# Patient Record
Sex: Male | Born: 1955
Health system: Southern US, Community
[De-identification: ages and names within clinical notes are randomized; demographics above are authoritative.]

## PROBLEM LIST (undated history)

## (undated) DIAGNOSIS — M549 Dorsalgia, unspecified: Secondary | ICD-10-CM

## (undated) DIAGNOSIS — F32A Depression, unspecified: Secondary | ICD-10-CM

## (undated) DIAGNOSIS — F419 Anxiety disorder, unspecified: Secondary | ICD-10-CM

## (undated) DIAGNOSIS — M1990 Unspecified osteoarthritis, unspecified site: Secondary | ICD-10-CM

## (undated) DIAGNOSIS — Z860100 Personal history of colon polyps, unspecified: Secondary | ICD-10-CM

## (undated) DIAGNOSIS — J449 Chronic obstructive pulmonary disease, unspecified: Secondary | ICD-10-CM

## (undated) DIAGNOSIS — I4891 Unspecified atrial fibrillation: Secondary | ICD-10-CM

## (undated) HISTORY — DX: Personal history of colon polyps, unspecified: Z86.0100

## (undated) HISTORY — PX: SPINAL CORD STIMULATOR IMPLANT: SHX2422

## (undated) HISTORY — DX: Personal history of colonic polyps: Z86.010

## (undated) HISTORY — DX: Anxiety disorder, unspecified: F41.9

## (undated) HISTORY — PX: NECK SURGERY: SHX720

## (undated) HISTORY — PX: WRIST SURGERY: SHX841

## (undated) HISTORY — PX: BRAIN SURGERY: SHX531

## (undated) HISTORY — DX: Depression, unspecified: F32.A

## (undated) HISTORY — PX: SHOULDER SURGERY: SHX246

---

## 1997-06-24 ENCOUNTER — Ambulatory Visit (HOSPITAL_COMMUNITY): Admission: EM | Admit: 1997-06-24 | Discharge: 1997-06-24 | Payer: Self-pay | Admitting: Neurosurgery

## 1998-04-07 ENCOUNTER — Emergency Department (HOSPITAL_COMMUNITY): Admission: EM | Admit: 1998-04-07 | Discharge: 1998-04-07 | Payer: Self-pay | Admitting: Emergency Medicine

## 1998-04-07 ENCOUNTER — Encounter: Payer: Self-pay | Admitting: Emergency Medicine

## 2010-02-26 ENCOUNTER — Encounter
Admission: RE | Admit: 2010-02-26 | Discharge: 2010-02-26 | Payer: Self-pay | Source: Home / Self Care | Attending: Orthopedic Surgery | Admitting: Orthopedic Surgery

## 2010-04-04 ENCOUNTER — Other Ambulatory Visit: Payer: Self-pay | Admitting: Orthopedic Surgery

## 2010-04-04 DIAGNOSIS — M25511 Pain in right shoulder: Secondary | ICD-10-CM

## 2010-04-09 ENCOUNTER — Ambulatory Visit
Admission: RE | Admit: 2010-04-09 | Discharge: 2010-04-09 | Disposition: A | Discharge status: Home / Self Care | Payer: Self-pay | Source: Ambulatory Visit | Attending: Orthopedic Surgery | Admitting: Orthopedic Surgery

## 2010-04-09 DIAGNOSIS — M25511 Pain in right shoulder: Secondary | ICD-10-CM

## 2010-04-30 ENCOUNTER — Encounter (HOSPITAL_COMMUNITY)
Admission: RE | Admit: 2010-04-30 | Discharge: 2010-04-30 | Disposition: A | Discharge status: Home / Self Care | Payer: Medicare Other | Source: Ambulatory Visit | Attending: Orthopedic Surgery | Admitting: Orthopedic Surgery

## 2010-04-30 DIAGNOSIS — Z0000 Encounter for general adult medical examination without abnormal findings: Secondary | ICD-10-CM | POA: Insufficient documentation

## 2010-04-30 LAB — BASIC METABOLIC PANEL
CO2: 31 mEq/L (ref 19–32)
Calcium: 9.2 mg/dL (ref 8.4–10.5)
GFR calc Af Amer: >60 mL/min (ref >60)
Glucose, Bld: 96 mg/dL (ref 70–99)
Potassium: 4.4 mEq/L (ref 3.5–5.1)
Sodium: 140 mEq/L (ref 135–145)

## 2010-04-30 LAB — CBC
HCT: 42.8 % (ref 39.0–52.0)
Hemoglobin: 14.6 g/dL (ref 13.0–17.0)
MCH: 29.8 pg (ref 26.0–34.0)
MCHC: 34.1 g/dL (ref 30.0–36.0)
MCV: 87.3 fL (ref 78.0–100.0)
RDW: 12.4 % (ref 11.5–15.5)

## 2010-04-30 LAB — URINALYSIS, ROUTINE W REFLEX MICROSCOPIC
Bilirubin Urine: NEGATIVE
Glucose, UA: NEGATIVE mg/dL
Hgb urine dipstick: NEGATIVE
Specific Gravity, Urine: 1.014 (ref 1.005–1.030)
Urobilinogen, UA: 0.2 mg/dL (ref 0.0–1.0)
pH: 6 (ref 5.0–8.0)

## 2010-04-30 LAB — DIFFERENTIAL
Basophils Absolute: 0 10*3/uL (ref 0.0–0.1)
Eosinophils Relative: 3 % (ref 0–5)
Lymphocytes Relative: 34 % (ref 12–46)
Monocytes Absolute: 0.7 10*3/uL (ref 0.1–1.0)
Monocytes Relative: 9 % (ref 3–12)
Neutro Abs: 4.3 10*3/uL (ref 1.7–7.7)

## 2010-04-30 LAB — SURGICAL PCR SCREEN: MRSA, PCR: NEGATIVE

## 2010-05-04 ENCOUNTER — Observation Stay (HOSPITAL_COMMUNITY)
Admission: RE | Admit: 2010-05-04 | Discharge: 2010-05-05 | Disposition: A | Discharge status: Home / Self Care | Payer: Medicare Other | Source: Ambulatory Visit | Attending: Orthopedic Surgery | Admitting: Orthopedic Surgery

## 2010-05-04 DIAGNOSIS — M25819 Other specified joint disorders, unspecified shoulder: Secondary | ICD-10-CM | POA: Insufficient documentation

## 2010-05-04 DIAGNOSIS — Z7902 Long term (current) use of antithrombotics/antiplatelets: Secondary | ICD-10-CM | POA: Insufficient documentation

## 2010-05-04 DIAGNOSIS — J45909 Unspecified asthma, uncomplicated: Secondary | ICD-10-CM | POA: Insufficient documentation

## 2010-05-04 DIAGNOSIS — Z79899 Other long term (current) drug therapy: Secondary | ICD-10-CM | POA: Insufficient documentation

## 2010-05-04 DIAGNOSIS — S43439A Superior glenoid labrum lesion of unspecified shoulder, initial encounter: Principal | ICD-10-CM | POA: Insufficient documentation

## 2010-05-04 DIAGNOSIS — I10 Essential (primary) hypertension: Secondary | ICD-10-CM | POA: Insufficient documentation

## 2010-05-04 DIAGNOSIS — X58XXXA Exposure to other specified factors, initial encounter: Secondary | ICD-10-CM | POA: Insufficient documentation

## 2010-05-05 LAB — BASIC METABOLIC PANEL
Chloride: 103 mEq/L (ref 96–112)
GFR calc non Af Amer: >60 mL/min (ref >60)
Glucose, Bld: 138 mg/dL — ABNORMAL HIGH (ref 70–99)
Potassium: 4.7 mEq/L (ref 3.5–5.1)
Sodium: 136 mEq/L (ref 135–145)

## 2010-05-05 LAB — CBC
HCT: 39.4 % (ref 39.0–52.0)
MCV: 87.6 fL (ref 78.0–100.0)
Platelets: 181 10*3/uL (ref 150–400)
RBC: 4.5 MIL/uL (ref 4.22–5.81)
RDW: 12.4 % (ref 11.5–15.5)
WBC: 8.4 10*3/uL (ref 4.0–10.5)

## 2010-05-05 NOTE — Op Note (Signed)
NAMECHANNON, AMBROSINI                ACCOUNT NO.:  [id number]  MEDICAL RECORD NO.:  [id number]           PATIENT TYPE:  O  LOCATION:  6834                         FACILITY:  [facility]  PHYSICIAN:  Doran Heater. Veverly Fells, M.D. DATE OF BIRTH:  1955-03-13  DATE OF PROCEDURE:  05/04/2010 DATE OF DISCHARGE:                              OPERATIVE REPORT   PREOPERATIVE DIAGNOSES:  Right shoulder impingement syndrome and superior labrum anterior and posterior lesion.  POSTOPERATIVE DIAGNOSES:  Right shoulder impingement syndrome and superior labrum anterior and posterior lesion.  PROCEDURE PERFORMED:  Right shoulder arthroscopy with extensive intraarticular debridement including debridement of superior labrum anterior and posterior lesion.  Arthroscopic biceps tenotomy, arthroscopic rotator interval release, arthroscopic subacromial decompression, limited secondary to os acromiale and open biceps tenodesis from the groove.  ATTENDING SURGEON:  Doran Heater. Veverly Fells, MD  ASSISTANT:  Abbott Pao. Dixon, PA-C.  ANESTHESIA:  General anesthesia was used plus interscalene block.  ESTIMATED BLOOD LOSS:  Minimal.  FLUID REPLACEMENT:  1200 mL of crystalloid.  INSTRUMENT COUNT:  Correct.  COMPLICATIONS:  There were no complications.  Perioperative antibiotics were given.  INDICATIONS:  The patient is a 55 year old male with worsening right shoulder pain secondary to slap lesion and impingement syndrome.  The patient has failed conservative management consisting of modification of activities, injections, anti-inflammatories, presents now for operative treatment.  Informed consent was obtained.  DESCRIPTION OF PROCEDURE:  After an adequate local anesthesia was achieved, the patient was positioned in a modified beach-chair position. Right shoulder was examined under anesthesia.  The patient did have some stiffness with excellent rotation and with his arm to the side, only able to externally rotate by 40  degrees.  Internal rotation was about 50 with his arm abducted, forward elevation was 140 to 150 degrees. Abduction was about 95-100 after exam under anesthesia including instability examination which was negative.  The patient had nice stable shoulder.  We went ahead and sterilely prepped and draped the shoulder and arm in usual manner.  We entered the shoulder using standard portals including anteroposterior and lateral portals.  We identified significant superior labral tear anterior to posterior with unstable biceps anchor, performed a biceps tenotomy and labral debridement, back to stable labral rim.  Anteroinferior labrum was also degeneratively torn in a general manner.  We performed a limited debridement of that area.  Chondroplasty was also performed, minimally sewed in the anteroinferior portion of glenoid which was a worn grade 3 and grade 4 changes.  The subscapularis was visualized and noted to be normal with a normal rolled edge.  Rotator cuff was visualized from the anterior and posterior portals and noted to be normal.  Posterior superior and posterior inferior labrum looked to be in pretty good shape.  We went ahead and placed the scope in subacromial space.  Thorough bursectomy and acromioplasty was performed.  We were very careful because of the os acromiale to just do a limited anterolateral acromioplasty basically at the very anterolateral edge more laterally than anteriorly.  The CA ligament was left intact, really did not appear to be any significant impingement from the shape of  the acromion process and again for fear of destabilizing a large os acromiale, we left that alone and we did press on the acromion and was felt to be stable, did not see any hypermobility there.  Rotator cuff was inspected in its entirety and was noted to be normal.  We took the shoulder through full range of motion, all impingement was noted.  We thoroughly irrigated the subdeltoid  interval and then concluded the arthroscopic portion of surgery.  We then made a small longitudinal incision overlying the biceps groove.  Dissection down through subcutaneous tissues using Bovie.  We split the deltoid in line with fibers bluntly.  I placed an Arthrex retractor, identified the biceps groove, divided the soft tissue overlying the biceps in line with a biceps tendon.  Delivered the tendon and the wound.  Whipstitched the tendon at the tenodesis area with #2 FiberWire suture and then went ahead and placed a Biomet juggernaut suture anchoring the floor of the bicipital groove, bring that suture up through, we reinforced the portion of tendon tying that down, flushed for nice low profile tenodesis, getting mid tension upward 90 degrees.  Once we completed that, we clipped off remaining biceps, took the shoulder through full range of motion, no impingement noted.  Thorough irrigation of the subdeltoid interval and repaired the deltoid to itself with 0 Vicryl suture, followed by 2-0 Vicryl subcutaneous closure and 4-0 Monocryl for skin and portals.  Steri-Strips applied followed by a sterile dressing. The patient tolerated the surgery well.  Doran Heater. Veverly Fells, M.D.     SRN/MEDQ  D:  05/04/2010  T:  05/05/2010  Job:  [id number]  Electronically Signed by Esmond Plants  on 05/05/2010 11:25:41 AM

## 2010-05-23 NOTE — Discharge Summary (Signed)
  [name], [name]                ACCOUNT NO.:  [id number]  MEDICAL RECORD NO.:  [id number]           PATIENT TYPE:  O  LOCATION:  9432                         FACILITY:  [facility]  PHYSICIAN:  Doran Heater. Veverly Fells, M.D. DATE OF BIRTH:  [birth year]  DATE OF ADMISSION:  05/04/2010 DATE OF DISCHARGE:  05/05/2010                              DISCHARGE SUMMARY   ADMISSION DIAGNOSIS:  Right shoulder pain secondary to impingement and superior labrum anterior and posterior lesion.  DISCHARGE DIAGNOSIS:  Right shoulder pain secondary to impingement and superior labrum anterior and posterior lesion status post shoulder arthroscopy.  BRIEF HISTORY:  The patient is a 55 year old male with worsening right shoulder pain secondary to a known superior labral tear along with some impingement to the right shoulder.  The patient elected for surgery to decrease pain and increase function.  PROCEDURE:  The patient had a right shoulder arthroscopy, biceps tenodesis performed by Dr. Esmond Plants on May 04, 2010, assistant was Shelle Iron, PA-C, general anesthesia was used, no complications.  HOSPITAL COURSE:  The patient was admitted on May 04, 2010, for the above-stated procedure which he tolerated well.  After adequate time in post anesthesia care, he was transferred to 5000.  Postop day 1, he has complained about moderate pain to the right shoulder.  He did stay until the afternoon to make sure his pain was under control.  Neurologically, he was intact.  No labs were given.  He was afebrile.  His wound was dressed and no signs of drainage.  DISCHARGE PLAN:  The patient will be discharged home on May 05, 2010. His condition is stable.  His diet is regular.  DISCHARGE MEDICATIONS: 1. Robaxin 500 mg p.o. q.6 h. 2. Percocet 5/325 one to two q.4-6 h. p.r.n. pain.     [name] Sanford, P.A.   ______________________________ Doran Heater. Veverly Fells, M.D.   TBD/MEDQ  D:  05/15/2010  T:  05/15/2010   Job:  [id number]  Electronically Signed by Shelle Iron P.A. on 05/22/2010 08:34:50 AM Electronically Signed by Esmond Plants  on 05/23/2010 08:08:13 PM

## 2013-12-10 DIAGNOSIS — G8929 Other chronic pain: Secondary | ICD-10-CM

## 2013-12-10 DIAGNOSIS — M549 Dorsalgia, unspecified: Secondary | ICD-10-CM | POA: Insufficient documentation

## 2013-12-10 DIAGNOSIS — T85695A Other mechanical complication of other nervous system device, implant or graft, initial encounter: Secondary | ICD-10-CM | POA: Insufficient documentation

## 2013-12-10 DIAGNOSIS — M546 Pain in thoracic spine: Secondary | ICD-10-CM

## 2013-12-10 HISTORY — DX: Other mechanical complication of other nervous system device, implant or graft, initial encounter: T85.695A

## 2013-12-10 HISTORY — DX: Other chronic pain: G89.29

## 2013-12-10 HISTORY — DX: Pain in thoracic spine: M54.6

## 2014-09-13 DIAGNOSIS — Z981 Arthrodesis status: Secondary | ICD-10-CM | POA: Insufficient documentation

## 2014-09-13 DIAGNOSIS — M545 Low back pain, unspecified: Secondary | ICD-10-CM | POA: Insufficient documentation

## 2014-09-13 DIAGNOSIS — M961 Postlaminectomy syndrome, not elsewhere classified: Secondary | ICD-10-CM | POA: Insufficient documentation

## 2014-09-13 HISTORY — DX: Low back pain, unspecified: M54.50

## 2014-09-13 HISTORY — DX: Arthrodesis status: Z98.1

## 2015-01-24 DIAGNOSIS — M545 Low back pain: Secondary | ICD-10-CM | POA: Diagnosis not present

## 2015-01-24 DIAGNOSIS — G8929 Other chronic pain: Secondary | ICD-10-CM | POA: Diagnosis not present

## 2015-02-02 DIAGNOSIS — Z79899 Other long term (current) drug therapy: Secondary | ICD-10-CM | POA: Diagnosis not present

## 2015-02-02 DIAGNOSIS — Z13220 Encounter for screening for lipoid disorders: Secondary | ICD-10-CM | POA: Diagnosis not present

## 2015-02-02 DIAGNOSIS — E291 Testicular hypofunction: Secondary | ICD-10-CM | POA: Diagnosis not present

## 2015-02-02 DIAGNOSIS — M546 Pain in thoracic spine: Secondary | ICD-10-CM | POA: Diagnosis not present

## 2015-02-08 DIAGNOSIS — F329 Major depressive disorder, single episode, unspecified: Secondary | ICD-10-CM | POA: Diagnosis not present

## 2015-02-08 DIAGNOSIS — M4727 Other spondylosis with radiculopathy, lumbosacral region: Secondary | ICD-10-CM | POA: Diagnosis not present

## 2015-02-08 DIAGNOSIS — E291 Testicular hypofunction: Secondary | ICD-10-CM | POA: Diagnosis not present

## 2015-02-08 DIAGNOSIS — R1013 Epigastric pain: Secondary | ICD-10-CM | POA: Diagnosis not present

## 2015-02-14 DIAGNOSIS — Z79899 Other long term (current) drug therapy: Secondary | ICD-10-CM | POA: Diagnosis not present

## 2015-02-14 DIAGNOSIS — G894 Chronic pain syndrome: Secondary | ICD-10-CM | POA: Diagnosis not present

## 2015-02-14 DIAGNOSIS — M961 Postlaminectomy syndrome, not elsewhere classified: Secondary | ICD-10-CM | POA: Diagnosis not present

## 2015-02-14 DIAGNOSIS — M545 Low back pain: Secondary | ICD-10-CM | POA: Diagnosis not present

## 2015-02-14 DIAGNOSIS — Z5181 Encounter for therapeutic drug level monitoring: Secondary | ICD-10-CM | POA: Diagnosis not present

## 2015-03-02 DIAGNOSIS — R1013 Epigastric pain: Secondary | ICD-10-CM | POA: Diagnosis not present

## 2015-03-02 DIAGNOSIS — L03119 Cellulitis of unspecified part of limb: Secondary | ICD-10-CM | POA: Diagnosis not present

## 2015-03-02 DIAGNOSIS — E291 Testicular hypofunction: Secondary | ICD-10-CM | POA: Diagnosis not present

## 2015-03-14 DIAGNOSIS — M961 Postlaminectomy syndrome, not elsewhere classified: Secondary | ICD-10-CM | POA: Diagnosis not present

## 2015-03-14 DIAGNOSIS — G894 Chronic pain syndrome: Secondary | ICD-10-CM | POA: Diagnosis not present

## 2015-03-14 DIAGNOSIS — M545 Low back pain: Secondary | ICD-10-CM | POA: Diagnosis not present

## 2015-03-14 DIAGNOSIS — M5417 Radiculopathy, lumbosacral region: Secondary | ICD-10-CM | POA: Diagnosis not present

## 2015-03-30 DIAGNOSIS — M2550 Pain in unspecified joint: Secondary | ICD-10-CM | POA: Diagnosis not present

## 2015-04-20 DIAGNOSIS — M4727 Other spondylosis with radiculopathy, lumbosacral region: Secondary | ICD-10-CM | POA: Diagnosis not present

## 2015-04-20 DIAGNOSIS — L219 Seborrheic dermatitis, unspecified: Secondary | ICD-10-CM | POA: Diagnosis not present

## 2015-04-21 DIAGNOSIS — M5417 Radiculopathy, lumbosacral region: Secondary | ICD-10-CM | POA: Diagnosis not present

## 2015-04-21 DIAGNOSIS — G894 Chronic pain syndrome: Secondary | ICD-10-CM | POA: Diagnosis not present

## 2015-04-21 DIAGNOSIS — M961 Postlaminectomy syndrome, not elsewhere classified: Secondary | ICD-10-CM | POA: Diagnosis not present

## 2015-04-21 DIAGNOSIS — M545 Low back pain: Secondary | ICD-10-CM | POA: Diagnosis not present

## 2015-04-27 DIAGNOSIS — Z79899 Other long term (current) drug therapy: Secondary | ICD-10-CM | POA: Diagnosis not present

## 2015-04-27 DIAGNOSIS — M5417 Radiculopathy, lumbosacral region: Secondary | ICD-10-CM | POA: Diagnosis not present

## 2015-04-27 DIAGNOSIS — G894 Chronic pain syndrome: Secondary | ICD-10-CM | POA: Diagnosis not present

## 2015-04-27 DIAGNOSIS — M961 Postlaminectomy syndrome, not elsewhere classified: Secondary | ICD-10-CM | POA: Diagnosis not present

## 2015-05-11 DIAGNOSIS — M4727 Other spondylosis with radiculopathy, lumbosacral region: Secondary | ICD-10-CM | POA: Diagnosis not present

## 2015-05-11 DIAGNOSIS — M2550 Pain in unspecified joint: Secondary | ICD-10-CM | POA: Diagnosis not present

## 2015-05-11 DIAGNOSIS — E291 Testicular hypofunction: Secondary | ICD-10-CM | POA: Diagnosis not present

## 2016-06-04 DIAGNOSIS — N390 Urinary tract infection, site not specified: Secondary | ICD-10-CM | POA: Insufficient documentation

## 2016-06-04 HISTORY — DX: Urinary tract infection, site not specified: N39.0

## 2016-06-19 DIAGNOSIS — R310 Gross hematuria: Secondary | ICD-10-CM | POA: Insufficient documentation

## 2016-06-19 HISTORY — DX: Gross hematuria: R31.0

## 2016-07-19 DIAGNOSIS — E785 Hyperlipidemia, unspecified: Secondary | ICD-10-CM | POA: Insufficient documentation

## 2016-07-19 DIAGNOSIS — F17200 Nicotine dependence, unspecified, uncomplicated: Secondary | ICD-10-CM | POA: Insufficient documentation

## 2016-07-19 DIAGNOSIS — R072 Precordial pain: Secondary | ICD-10-CM

## 2016-07-19 DIAGNOSIS — IMO0001 Reserved for inherently not codable concepts without codable children: Secondary | ICD-10-CM

## 2016-07-19 HISTORY — DX: Precordial pain: R07.2

## 2016-07-19 HISTORY — DX: Nicotine dependence, unspecified, uncomplicated: F17.200

## 2016-07-19 HISTORY — DX: Reserved for inherently not codable concepts without codable children: IMO0001

## 2016-12-31 DIAGNOSIS — Z9889 Other specified postprocedural states: Secondary | ICD-10-CM

## 2016-12-31 HISTORY — DX: Other specified postprocedural states: Z98.890

## 2017-04-01 DIAGNOSIS — M961 Postlaminectomy syndrome, not elsewhere classified: Secondary | ICD-10-CM | POA: Insufficient documentation

## 2017-04-01 DIAGNOSIS — T85192A Other mechanical complication of implanted electronic neurostimulator (electrode) of spinal cord, initial encounter: Secondary | ICD-10-CM

## 2017-04-01 HISTORY — DX: Postlaminectomy syndrome, not elsewhere classified: M96.1

## 2017-04-01 HISTORY — DX: Other mechanical complication of implanted electronic neurostimulator of spinal cord electrode (lead), initial encounter: T85.192A

## 2018-08-25 DIAGNOSIS — L02214 Cutaneous abscess of groin: Secondary | ICD-10-CM | POA: Insufficient documentation

## 2018-08-25 DIAGNOSIS — R1031 Right lower quadrant pain: Secondary | ICD-10-CM

## 2018-08-25 HISTORY — DX: Right lower quadrant pain: R10.31

## 2018-08-25 HISTORY — DX: Cutaneous abscess of groin: L02.214

## 2019-05-09 ENCOUNTER — Emergency Department (HOSPITAL_COMMUNITY): Payer: Medicare Other

## 2019-05-09 ENCOUNTER — Inpatient Hospital Stay (HOSPITAL_COMMUNITY)
Admission: EM | Admit: 2019-05-09 | Discharge: 2019-05-11 | DRG: 314 | Disposition: A | Discharge status: Home / Self Care | Payer: Medicare Other | Attending: Internal Medicine | Admitting: Internal Medicine

## 2019-05-09 ENCOUNTER — Observation Stay (HOSPITAL_COMMUNITY): Payer: Medicare Other

## 2019-05-09 ENCOUNTER — Other Ambulatory Visit: Payer: Self-pay

## 2019-05-09 ENCOUNTER — Encounter (HOSPITAL_COMMUNITY): Payer: Self-pay | Admitting: Emergency Medicine

## 2019-05-09 DIAGNOSIS — F17210 Nicotine dependence, cigarettes, uncomplicated: Secondary | ICD-10-CM | POA: Diagnosis present

## 2019-05-09 DIAGNOSIS — I509 Heart failure, unspecified: Secondary | ICD-10-CM

## 2019-05-09 DIAGNOSIS — I2723 Pulmonary hypertension due to lung diseases and hypoxia: Principal | ICD-10-CM | POA: Diagnosis present

## 2019-05-09 DIAGNOSIS — M549 Dorsalgia, unspecified: Secondary | ICD-10-CM | POA: Diagnosis present

## 2019-05-09 DIAGNOSIS — I5033 Acute on chronic diastolic (congestive) heart failure: Secondary | ICD-10-CM | POA: Diagnosis present

## 2019-05-09 DIAGNOSIS — Z9682 Presence of neurostimulator: Secondary | ICD-10-CM

## 2019-05-09 DIAGNOSIS — E669 Obesity, unspecified: Secondary | ICD-10-CM | POA: Diagnosis present

## 2019-05-09 DIAGNOSIS — Z20822 Contact with and (suspected) exposure to covid-19: Secondary | ICD-10-CM | POA: Diagnosis present

## 2019-05-09 DIAGNOSIS — I4891 Unspecified atrial fibrillation: Secondary | ICD-10-CM

## 2019-05-09 DIAGNOSIS — Z23 Encounter for immunization: Secondary | ICD-10-CM

## 2019-05-09 DIAGNOSIS — Z79899 Other long term (current) drug therapy: Secondary | ICD-10-CM

## 2019-05-09 DIAGNOSIS — R0600 Dyspnea, unspecified: Secondary | ICD-10-CM

## 2019-05-09 DIAGNOSIS — R7989 Other specified abnormal findings of blood chemistry: Secondary | ICD-10-CM | POA: Diagnosis present

## 2019-05-09 DIAGNOSIS — J449 Chronic obstructive pulmonary disease, unspecified: Secondary | ICD-10-CM | POA: Diagnosis present

## 2019-05-09 DIAGNOSIS — Z79890 Hormone replacement therapy (postmenopausal): Secondary | ICD-10-CM

## 2019-05-09 DIAGNOSIS — M1990 Unspecified osteoarthritis, unspecified site: Secondary | ICD-10-CM | POA: Diagnosis present

## 2019-05-09 DIAGNOSIS — I480 Paroxysmal atrial fibrillation: Secondary | ICD-10-CM | POA: Diagnosis present

## 2019-05-09 DIAGNOSIS — G8929 Other chronic pain: Secondary | ICD-10-CM | POA: Diagnosis present

## 2019-05-09 DIAGNOSIS — Z6838 Body mass index (BMI) 38.0-38.9, adult: Secondary | ICD-10-CM

## 2019-05-09 HISTORY — DX: Heart failure, unspecified: I50.9

## 2019-05-09 HISTORY — DX: Unspecified osteoarthritis, unspecified site: M19.90

## 2019-05-09 HISTORY — DX: Dorsalgia, unspecified: M54.9

## 2019-05-09 HISTORY — DX: Chronic obstructive pulmonary disease, unspecified: J44.9

## 2019-05-09 LAB — CBC
HCT: 47 % (ref 39.0–52.0)
Hemoglobin: 14.8 g/dL (ref 13.0–17.0)
MCH: 30.2 pg (ref 26.0–34.0)
MCHC: 31.5 g/dL (ref 30.0–36.0)
MCV: 95.9 fL (ref 80.0–100.0)
Platelets: 216 10*3/uL (ref 150–400)
RBC: 4.9 MIL/uL (ref 4.22–5.81)
RDW: 13.4 % (ref 11.5–15.5)
WBC: 7.5 10*3/uL (ref 4.0–10.5)
nRBC: 0 % (ref 0.0–0.2)

## 2019-05-09 LAB — BASIC METABOLIC PANEL
Anion gap: 10 (ref 5–15)
BUN: 5 mg/dL — ABNORMAL LOW (ref 8–23)
CO2: 30 mmol/L (ref 22–32)
Calcium: 9 mg/dL (ref 8.9–10.3)
Chloride: 97 mmol/L — ABNORMAL LOW (ref 98–111)
Creatinine, Ser: 0.85 mg/dL (ref 0.61–1.24)
GFR calc Af Amer: >60 mL/min (ref >60)
GFR calc non Af Amer: >60 mL/min (ref >60)
Glucose, Bld: 142 mg/dL — ABNORMAL HIGH (ref 70–99)
Potassium: 4.1 mmol/L (ref 3.5–5.1)
Sodium: 137 mmol/L (ref 135–145)

## 2019-05-09 LAB — TROPONIN I (HIGH SENSITIVITY)
Troponin I (High Sensitivity): 8 ng/L (ref <18)
Troponin I (High Sensitivity): 8 ng/L (ref <18)

## 2019-05-09 LAB — BRAIN NATRIURETIC PEPTIDE: B Natriuretic Peptide: 247.7 pg/mL — ABNORMAL HIGH (ref 0.0–100.0)

## 2019-05-09 LAB — LIPID PANEL
Cholesterol: 191 mg/dL (ref 0–200)
HDL: 47 mg/dL (ref >40)
LDL Cholesterol: 131 mg/dL — ABNORMAL HIGH (ref 0–99)
Total CHOL/HDL Ratio: 4.1 RATIO
Triglycerides: 63 mg/dL (ref <150)
VLDL: 13 mg/dL (ref 0–40)

## 2019-05-09 LAB — ECHOCARDIOGRAM COMPLETE
Height: 72 in
Weight: 4512 oz

## 2019-05-09 LAB — HEMOGLOBIN A1C
Hgb A1c MFr Bld: 7.1 % — ABNORMAL HIGH (ref 4.8–5.6)
Mean Plasma Glucose: 157.07 mg/dL

## 2019-05-09 LAB — PHOSPHORUS: Phosphorus: 2.5 mg/dL (ref 2.5–4.6)

## 2019-05-09 LAB — TSH: TSH: 0.72 u[IU]/mL (ref 0.350–4.500)

## 2019-05-09 LAB — MAGNESIUM: Magnesium: 2.1 mg/dL (ref 1.7–2.4)

## 2019-05-09 LAB — RESPIRATORY PANEL BY RT PCR (FLU A&B, COVID)
Influenza A by PCR: NEGATIVE
Influenza B by PCR: NEGATIVE
SARS Coronavirus 2 by RT PCR: NEGATIVE

## 2019-05-09 MED ORDER — OXYCODONE HCL 5 MG PO TABS
30.0000 mg | ORAL_TABLET | ORAL | Status: DC | PRN
  Administered 2019-05-09 – 2019-05-11 (×10): 30 mg via ORAL
  Filled 2019-05-09 (×10): qty 6

## 2019-05-09 MED ORDER — METHYLPREDNISOLONE SODIUM SUCC 125 MG IJ SOLR
125.0000 mg | Freq: Once | INTRAMUSCULAR | Status: AC
  Administered 2019-05-09: 125 mg via INTRAVENOUS
  Filled 2019-05-09: qty 2

## 2019-05-09 MED ORDER — ACETAMINOPHEN 650 MG RE SUPP: 650.0000 mg | Freq: Four times a day (QID) | RECTAL | Status: DC | PRN

## 2019-05-09 MED ORDER — SODIUM CHLORIDE 0.9 % IV SOLN
500.0000 mg | Freq: Once | INTRAVENOUS | Status: AC
  Administered 2019-05-09: 500 mg via INTRAVENOUS
  Filled 2019-05-09: qty 500

## 2019-05-09 MED ORDER — FUROSEMIDE 10 MG/ML IJ SOLN
60.0000 mg | Freq: Once | INTRAMUSCULAR | Status: AC
  Administered 2019-05-09: 60 mg via INTRAVENOUS
  Filled 2019-05-09: qty 6

## 2019-05-09 MED ORDER — SODIUM CHLORIDE 0.9% FLUSH
3.0000 mL | Freq: Once | INTRAVENOUS | Status: AC
  Administered 2019-05-09: 3 mL via INTRAVENOUS

## 2019-05-09 MED ORDER — ALBUTEROL SULFATE (2.5 MG/3ML) 0.083% IN NEBU: 3.0000 mL | INHALATION_SOLUTION | RESPIRATORY_TRACT | Status: DC | PRN

## 2019-05-09 MED ORDER — ENOXAPARIN SODIUM 40 MG/0.4ML ~~LOC~~ SOLN
40.0000 mg | SUBCUTANEOUS | Status: DC
  Administered 2019-05-10: 40 mg via SUBCUTANEOUS
  Filled 2019-05-09: qty 0.4

## 2019-05-09 MED ORDER — MOMETASONE FURO-FORMOTEROL FUM 200-5 MCG/ACT IN AERO
2.0000 | INHALATION_SPRAY | Freq: Two times a day (BID) | RESPIRATORY_TRACT | Status: DC
  Administered 2019-05-10 – 2019-05-11 (×2): 2 via RESPIRATORY_TRACT
  Filled 2019-05-09: qty 8.8

## 2019-05-09 MED ORDER — ALBUTEROL SULFATE HFA 108 (90 BASE) MCG/ACT IN AERS
2.0000 | INHALATION_SPRAY | Freq: Once | RESPIRATORY_TRACT | Status: AC
  Administered 2019-05-09: 2 via RESPIRATORY_TRACT
  Filled 2019-05-09: qty 6.7

## 2019-05-09 MED ORDER — ACETAMINOPHEN 325 MG PO TABS: 650.0000 mg | ORAL_TABLET | Freq: Four times a day (QID) | ORAL | Status: DC | PRN

## 2019-05-09 MED ORDER — AZITHROMYCIN 1 G PO PACK
1.0000 g | PACK | Freq: Once | ORAL | Status: DC
Start: 2019-05-09 — End: 2019-05-09

## 2019-05-09 MED ORDER — SODIUM CHLORIDE 0.9 % IV SOLN
1.0000 g | Freq: Once | INTRAVENOUS | Status: AC
  Administered 2019-05-09: 1 g via INTRAVENOUS
  Filled 2019-05-09: qty 10

## 2019-05-09 MED ORDER — LINACLOTIDE 145 MCG PO CAPS
290.0000 ug | ORAL_CAPSULE | Freq: Every morning | ORAL | Status: DC
  Administered 2019-05-10 – 2019-05-11 (×2): 290 ug via ORAL
  Filled 2019-05-09 (×2): qty 2

## 2019-05-09 MED ORDER — HYDROMORPHONE HCL ER 8 MG PO T24A
16.0000 mg | EXTENDED_RELEASE_TABLET | Freq: Every day | ORAL | Status: DC
  Administered 2019-05-10 – 2019-05-11 (×2): 16 mg via ORAL
  Filled 2019-05-09 (×2): qty 2

## 2019-05-09 MED ORDER — IPRATROPIUM-ALBUTEROL 0.5-2.5 (3) MG/3ML IN SOLN
3.0000 mL | Freq: Three times a day (TID) | RESPIRATORY_TRACT | Status: DC
  Administered 2019-05-09: 3 mL via RESPIRATORY_TRACT
  Filled 2019-05-09 (×2): qty 3

## 2019-05-09 NOTE — ED Notes (Signed)
The pt is hungry  He is npo in his orders  Pt not happy

## 2019-05-09 NOTE — ED Notes (Signed)
Admitting doctors at the bedside githromax piggybacked into nss   The residents stopped the nss for diaphoresis

## 2019-05-09 NOTE — Progress Notes (Signed)
Tele called around 2200 that patient converted to NSR.   MD was made aware and EKG was done.

## 2019-05-09 NOTE — ED Notes (Signed)
Pt transported to Xray.

## 2019-05-09 NOTE — ED Notes (Signed)
Tech here to do his echo

## 2019-05-09 NOTE — ED Notes (Signed)
The pt is c/o lower back pain he usually takes oxycontin and or oxycodone  For the back pain  hes asking if he can take his own med  no

## 2019-05-09 NOTE — ED Provider Notes (Signed)
[facility] EMERGENCY DEPARTMENT Provider Note   CSN: [id number] Arrival date & time: 05/09/19  1040     History Chief Complaint  Patient presents with  . Shortness of Breath    [name] Sanford is a 64 y.o. male.  64 year old male with prior medical history as detailed below presents for evaluation of shortness of breath.  Patient reports gradually worsening shortness of breath over the last several days.  Symptoms became worse this morning.  He reports a longstanding history of COPD.  He attempted to use breathing treatment at home without improvement of his symptoms.  He reports a mild cough without production.  He denies fever.  He denies chest pain.  He denies known exposure to Covid positive patient.    The history is provided by the patient and medical records.  Shortness of Breath Severity:  Moderate Onset quality:  Gradual Duration:  3 days Timing:  Constant Progression:  Worsening Chronicity:  New Context: activity   Relieved by:  Nothing Worsened by:  Nothing      Past Medical History:  Diagnosis Date  . Arthritis   . Back pain   . COPD (chronic obstructive pulmonary disease) (HCC)     There are no problems to display for this patient.   Past Surgical History:  Procedure Laterality Date  . BRAIN SURGERY    . NECK SURGERY    . SHOULDER SURGERY    . SPINAL CORD STIMULATOR IMPLANT         No family history on file.  Social History   Tobacco Use  . Smoking status: Current Every Day Smoker  . Smokeless tobacco: Never Used  Substance Use Topics  . Alcohol use: Not Currently  . Drug use: Never    Home Medications Prior to Admission medications   Not on File    Allergies    Patient has no allergy information on record.  Review of Systems   Review of Systems  Respiratory: Positive for shortness of breath.   All other systems reviewed and are negative.   Physical Exam Updated Vital Signs BP (!) 153/77   Pulse 74    Temp 98.9 F (37.2 C) (Oral)   Resp 18   Ht 6' (1.829 m)   Wt 127.9 kg   SpO2 97%   BMI 38.25 kg/m   Physical Exam Vitals and nursing note reviewed.  Constitutional:      General: He is not in acute distress.    Appearance: He is well-developed.  HENT:     Head: Normocephalic and atraumatic.  Eyes:     Conjunctiva/sclera: Conjunctivae normal.     Pupils: Pupils are equal, round, and reactive to light.  Cardiovascular:     Rate and Rhythm: Normal rate and regular rhythm.     Heart sounds: Normal heart sounds.  Pulmonary:     Effort: Pulmonary effort is normal. No respiratory distress.     Comments: Mild diffuse expiratory wheezes in all lung fields Abdominal:     General: There is no distension.     Palpations: Abdomen is soft.     Tenderness: There is no abdominal tenderness.  Musculoskeletal:        General: No deformity. Normal range of motion.     Cervical back: Normal range of motion and neck supple.  Skin:    General: Skin is warm and dry.  Neurological:     General: No focal deficit present.     Mental Status: He is  alert and oriented to person, place, and time.     ED Results / Procedures / Treatments   Labs (all labs ordered are listed, but only abnormal results are displayed) Labs Reviewed  BASIC METABOLIC PANEL - Abnormal; Notable for the following components:      Result Value   Chloride 97 (*)    Glucose, Bld 142 (*)    BUN 5 (*)    All other components within normal limits  BRAIN NATRIURETIC PEPTIDE - Abnormal; Notable for the following components:   B Natriuretic Peptide 247.7 (*)    All other components within normal limits  RESPIRATORY PANEL BY RT PCR (FLU A&B, COVID)  CBC  TROPONIN I (HIGH SENSITIVITY)  TROPONIN I (HIGH SENSITIVITY)    EKG EKG Interpretation  Date/Time:  Sunday May 09 2019 12:57:33 EDT Ventricular Rate:  72 PR Interval:    QRS Duration: 84 QT Interval:  361 QTC Calculation: 395 R Axis:   72 Text  Interpretation: Atrial fibrillation Confirmed by Dene Gentry [phone]) on 05/09/2019 12:59:24 PM   Radiology DG Chest 2 View  Result Date: 05/09/2019 CLINICAL DATA:  Shortness of breath. EXAM: CHEST - 2 VIEW COMPARISON:  12/30/2014 FINDINGS: Heart size is mildly enlarged. There are fine, diffuse infiltrates throughout the lungs bilaterally. No Kerley B-lines or evidence for pulmonary venous congestion. No discrete consolidations. Remote cervical fusion. IMPRESSION: Diffuse infiltrates throughout the lungs bilaterally. Appearance favors infectious process over or edema. Electronically Signed   By: Nolon Nations M.D.   On: 05/09/2019 11:45    Procedures Procedures (including critical care time)  Medications Ordered in ED Medications  sodium chloride flush (NS) 0.9 % injection 3 mL (has no administration in time range)  albuterol (VENTOLIN HFA) 108 (90 Base) MCG/ACT inhaler 2 puff (has no administration in time range)  methylPREDNISolone sodium succinate (SOLU-MEDROL) 125 mg/2 mL injection 125 mg (has no administration in time range)  cefTRIAXone (ROCEPHIN) 1 g in sodium chloride 0.9 % 100 mL IVPB (has no administration in time range)  azithromycin (ZITHROMAX) 500 mg in sodium chloride 0.9 % 250 mL IVPB (has no administration in time range)    ED Course  I have reviewed the triage vital signs and the nursing notes.  Pertinent labs & imaging results that were available during my care of the patient were reviewed by me and considered in my medical decision making (see chart for details).  Clinical Course as of May 09 1231  Sun May 09, 2019  1152 Anion gap: 10 [PM]    Clinical Course User Index [PM] [name], MD   MDM Rules/Calculators/A&P                      MDM  Screen complete  [name] Sanford was evaluated in Emergency Department on 05/09/2019 for the symptoms described in the history of present illness. He was evaluated in the context of the global COVID-19 pandemic,  which necessitated consideration that the patient might be at risk for infection with the SARS-CoV-2 virus that causes COVID-19. Institutional protocols and algorithms that pertain to the evaluation of patients at risk for COVID-19 are in a state of rapid change based on information released by regulatory bodies including the CDC and federal and state organizations. These policies and algorithms were followed during the patient's care in the ED.  Patient is presenting for evaluation of reported shortness of breath.  Patient does have wheezing on exam.  His presentation is consistent with possible element  of COPD exacerbation.  Chest x-ray demonstrates possible infectious infiltrates.   Steroids and antibiotics initiated in the ED.  Following his ED course he does feel mildly improved.  Patient appears to be appropriate for admission.  Covid test is pending at time of admission.  His primary care is obtained at the [facility] in [place].  Medicine team is aware of case and will evaluate for admission.  Final Clinical Impression(s) / ED Diagnoses Final diagnoses:  Dyspnea, unspecified type    Rx / DC Orders ED Discharge Orders    None       [name], MD 05/09/19 1415
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
## 2019-05-09 NOTE — ED Notes (Signed)
Respiratory panel already resulted from earlier swab  Neg for flu and covid

## 2019-05-09 NOTE — ED Notes (Signed)
The pt had a nss drip  Because the zithromax iv was burning his arm

## 2019-05-09 NOTE — ED Triage Notes (Signed)
C/o SOB since last night that is worse when lying.  Reports bilateral leg swelling and swelling in face that has improved over the last few weeks. New onset Afib.

## 2019-05-09 NOTE — Progress Notes (Signed)
  Echocardiogram 2D Echocardiogram has been performed.  Elmer Ramp 05/09/2019, 4:38 PM

## 2019-05-10 DIAGNOSIS — I509 Heart failure, unspecified: Secondary | ICD-10-CM | POA: Diagnosis present

## 2019-05-10 DIAGNOSIS — G8929 Other chronic pain: Secondary | ICD-10-CM

## 2019-05-10 DIAGNOSIS — Z79890 Hormone replacement therapy (postmenopausal): Secondary | ICD-10-CM | POA: Diagnosis not present

## 2019-05-10 DIAGNOSIS — Z79899 Other long term (current) drug therapy: Secondary | ICD-10-CM | POA: Diagnosis not present

## 2019-05-10 DIAGNOSIS — R7989 Other specified abnormal findings of blood chemistry: Secondary | ICD-10-CM | POA: Diagnosis present

## 2019-05-10 DIAGNOSIS — I5033 Acute on chronic diastolic (congestive) heart failure: Secondary | ICD-10-CM | POA: Diagnosis present

## 2019-05-10 DIAGNOSIS — E669 Obesity, unspecified: Secondary | ICD-10-CM | POA: Diagnosis present

## 2019-05-10 DIAGNOSIS — Z23 Encounter for immunization: Secondary | ICD-10-CM | POA: Diagnosis present

## 2019-05-10 DIAGNOSIS — I2723 Pulmonary hypertension due to lung diseases and hypoxia: Secondary | ICD-10-CM | POA: Diagnosis present

## 2019-05-10 DIAGNOSIS — Z9682 Presence of neurostimulator: Secondary | ICD-10-CM | POA: Diagnosis not present

## 2019-05-10 DIAGNOSIS — M549 Dorsalgia, unspecified: Secondary | ICD-10-CM | POA: Diagnosis present

## 2019-05-10 DIAGNOSIS — Z87828 Personal history of other (healed) physical injury and trauma: Secondary | ICD-10-CM

## 2019-05-10 DIAGNOSIS — I480 Paroxysmal atrial fibrillation: Secondary | ICD-10-CM

## 2019-05-10 DIAGNOSIS — Z7951 Long term (current) use of inhaled steroids: Secondary | ICD-10-CM

## 2019-05-10 DIAGNOSIS — Z6838 Body mass index (BMI) 38.0-38.9, adult: Secondary | ICD-10-CM | POA: Diagnosis not present

## 2019-05-10 DIAGNOSIS — Z7901 Long term (current) use of anticoagulants: Secondary | ICD-10-CM

## 2019-05-10 DIAGNOSIS — F17210 Nicotine dependence, cigarettes, uncomplicated: Secondary | ICD-10-CM | POA: Diagnosis present

## 2019-05-10 DIAGNOSIS — J449 Chronic obstructive pulmonary disease, unspecified: Secondary | ICD-10-CM | POA: Diagnosis present

## 2019-05-10 DIAGNOSIS — Z20822 Contact with and (suspected) exposure to covid-19: Secondary | ICD-10-CM | POA: Diagnosis present

## 2019-05-10 DIAGNOSIS — I110 Hypertensive heart disease with heart failure: Secondary | ICD-10-CM | POA: Diagnosis not present

## 2019-05-10 DIAGNOSIS — M1990 Unspecified osteoarthritis, unspecified site: Secondary | ICD-10-CM | POA: Diagnosis present

## 2019-05-10 DIAGNOSIS — I5031 Acute diastolic (congestive) heart failure: Secondary | ICD-10-CM | POA: Diagnosis not present

## 2019-05-10 LAB — CBC WITH DIFFERENTIAL/PLATELET
Abs Immature Granulocytes: 0.06 10*3/uL (ref 0.00–0.07)
Basophils Absolute: 0 10*3/uL (ref 0.0–0.1)
Basophils Relative: 0 %
Eosinophils Absolute: 0 10*3/uL (ref 0.0–0.5)
Eosinophils Relative: 0 %
HCT: 45.3 % (ref 39.0–52.0)
Hemoglobin: 14.3 g/dL (ref 13.0–17.0)
Immature Granulocytes: 1 %
Lymphocytes Relative: 7 %
Lymphs Abs: 0.9 10*3/uL (ref 0.7–4.0)
MCH: 29.1 pg (ref 26.0–34.0)
MCHC: 31.6 g/dL (ref 30.0–36.0)
MCV: 92.3 fL (ref 80.0–100.0)
Monocytes Absolute: 0.8 10*3/uL (ref 0.1–1.0)
Monocytes Relative: 6 %
Neutro Abs: 10.9 10*3/uL — ABNORMAL HIGH (ref 1.7–7.7)
Neutrophils Relative %: 86 %
Platelets: 264 10*3/uL (ref 150–400)
RBC: 4.91 MIL/uL (ref 4.22–5.81)
RDW: 13.5 % (ref 11.5–15.5)
WBC: 12.6 10*3/uL — ABNORMAL HIGH (ref 4.0–10.5)
nRBC: 0 % (ref 0.0–0.2)

## 2019-05-10 LAB — RESPIRATORY PANEL BY PCR

## 2019-05-10 LAB — COMPREHENSIVE METABOLIC PANEL
ALT: 17 U/L (ref 0–44)
AST: 18 U/L (ref 15–41)
Albumin: 3.1 g/dL — ABNORMAL LOW (ref 3.5–5.0)
Alkaline Phosphatase: 69 U/L (ref 38–126)
Anion gap: 12 (ref 5–15)
BUN: 11 mg/dL (ref 8–23)
CO2: 34 mmol/L — ABNORMAL HIGH (ref 22–32)
Calcium: 9.2 mg/dL (ref 8.9–10.3)
Chloride: 94 mmol/L — ABNORMAL LOW (ref 98–111)
Creatinine, Ser: 1.17 mg/dL (ref 0.61–1.24)
GFR calc Af Amer: >60 mL/min (ref >60)
GFR calc non Af Amer: >60 mL/min (ref >60)
Glucose, Bld: 147 mg/dL — ABNORMAL HIGH (ref 70–99)
Potassium: 4.3 mmol/L (ref 3.5–5.1)
Sodium: 140 mmol/L (ref 135–145)
Total Bilirubin: 0.5 mg/dL (ref 0.3–1.2)
Total Protein: 6.5 g/dL (ref 6.5–8.1)

## 2019-05-10 LAB — HIV ANTIBODY (ROUTINE TESTING W REFLEX): HIV Screen 4th Generation wRfx: NONREACTIVE

## 2019-05-10 MED ORDER — FUROSEMIDE 10 MG/ML IJ SOLN
80.0000 mg | Freq: Two times a day (BID) | INTRAMUSCULAR | Status: AC
  Administered 2019-05-10: 80 mg via INTRAVENOUS
  Filled 2019-05-10: qty 8

## 2019-05-10 MED ORDER — FUROSEMIDE 10 MG/ML IJ SOLN
40.0000 mg | Freq: Two times a day (BID) | INTRAMUSCULAR | Status: DC
  Administered 2019-05-10: 40 mg via INTRAVENOUS
  Filled 2019-05-10: qty 4

## 2019-05-10 MED ORDER — IPRATROPIUM-ALBUTEROL 0.5-2.5 (3) MG/3ML IN SOLN
3.0000 mL | Freq: Three times a day (TID) | RESPIRATORY_TRACT | Status: DC
  Administered 2019-05-10: 3 mL via RESPIRATORY_TRACT
  Filled 2019-05-10: qty 3

## 2019-05-10 MED ORDER — IPRATROPIUM-ALBUTEROL 0.5-2.5 (3) MG/3ML IN SOLN: 3.0000 mL | Freq: Three times a day (TID) | RESPIRATORY_TRACT | Status: DC | PRN

## 2019-05-10 MED ORDER — FUROSEMIDE 10 MG/ML IJ SOLN: 60.0000 mg | Freq: Two times a day (BID) | INTRAMUSCULAR | Status: DC

## 2019-05-10 MED ORDER — INFLUENZA VAC SPLIT QUAD 0.5 ML IM SUSY
0.5000 mL | PREFILLED_SYRINGE | INTRAMUSCULAR | Status: AC
  Administered 2019-05-11: 0.5 mL via INTRAMUSCULAR
  Filled 2019-05-10: qty 0.5

## 2019-05-10 MED ORDER — NICOTINE 14 MG/24HR TD PT24
14.0000 mg | MEDICATED_PATCH | Freq: Every day | TRANSDERMAL | Status: DC
  Administered 2019-05-10 – 2019-05-11 (×2): 14 mg via TRANSDERMAL
  Filled 2019-05-10 (×2): qty 1

## 2019-05-10 NOTE — Evaluation (Signed)
Physical Therapy Evaluation Patient Details Name: [name] Sanford MRN: [id number] DOB: February 03, 1956 Today's Date: 05/10/2019   History of Present Illness  Pt is a 64 y/o male with PMH of COPD, neck surgery, and chronic pain, presenting for SOB and LE swelling of 2-3 months. Found with elevated BNP and concernting for CHF exacerbation, a fib.   Clinical Impression  Pt was seen for mobility and strengthening, noted his fluctuations of O2 sats that are occurring on room air at rest and with activity.  Will follow up tomorrow to be sure that lasix is managing his O2 needs to require only room air, and to be sure he is tolerant even of stairs for home.  Nursing aware of PT plan to return tomorrow and recheck and report to MD and nursing.  Follow acutely for these needs, and will not need a plan to follow up at DC for now with PT.    Follow Up Recommendations No PT follow up    Equipment Recommendations  None recommended by PT    Recommendations for Other Services       Precautions / Restrictions Precautions Precautions: Fall Precaution Comments: monitor O2 sats Restrictions Weight Bearing Restrictions: No      Mobility  Bed Mobility Overal bed mobility: Modified Independent             General bed mobility comments: using rail and extra time  Transfers Overall transfer level: Modified independent Equipment used: 1 person hand held assist             General transfer comment: increased attempts and time  Ambulation/Gait Ambulation/Gait assistance: Min guard(for safety) Gait Distance (Feet): 100 Feet Assistive device: 1 person hand held assist Gait Pattern/deviations: Step-through pattern;Drifts right/left;Wide base of support;Shuffle;Decreased stride length;Decreased weight shift to right Gait velocity: reduced Gait velocity interpretation: <1.31 ft/sec, indicative of household ambulator General Gait Details: minimizing R knee pain with wgt shift, sats dropped with gait  and remained non verbal and with two standing rests to manage room air  Stairs Stairs: (pended due to O2 sats on room air)          Wheelchair Mobility    Modified Rankin (Stroke Patients Only)       Balance Overall balance assessment: Mild deficits observed, not formally tested(No falls per pt)                                           Pertinent Vitals/Pain Pain Assessment: Faces Faces Pain Scale: Hurts even more Pain Location: lower back, R LE  Pain Descriptors / Indicators: Grimacing;Guarding Pain Intervention(s): Limited activity within patient's tolerance;Monitored during session;Repositioned    Home Living Family/patient expects to be discharged to:: Private residence Living Arrangements: Spouse/significant other;Children;Other (Comment) Available Help at Discharge: Family;Available 24 hours/day Type of Home: House Home Access: Stairs to enter Entrance Stairs-Rails: Psychiatric nurse of Steps: 5 Home Layout: One level Home Equipment: Cane - single point;Walker - 2 wheels;Shower seat;Bedside commode;Hand held shower head;Adaptive equipment      Prior Function Level of Independence: Independent with assistive device(s)         Comments: SPC at times for R knee OA     Hand Dominance   Dominant Hand: Right    Extremity/Trunk Assessment   Upper Extremity Assessment Upper Extremity Assessment: Overall WFL for tasks assessed    Lower Extremity Assessment Lower Extremity Assessment: Overall [facility]  for tasks assessed    Cervical / Trunk Assessment Cervical / Trunk Assessment: Normal  Communication   Communication: No difficulties  Cognition Arousal/Alertness: Awake/alert Behavior During Therapy: WFL for tasks assessed/performed Overall Cognitive Status: Within Functional Limits for tasks assessed                                        General Comments General comments (skin integrity, edema, etc.):  fluctuations of O2 sats, 92% at rest, dropped to 85% in room and rested up to 93%, then on hall was down to 87% and stood to rest and recover x 2;  in room after was as low as 77% recovering but as high as 100% when focusing on breathing.  Nursing in to discuss Lasix with pt    Exercises     Assessment/Plan    PT Assessment Patient needs continued PT services  PT Problem List Decreased range of motion;Decreased activity tolerance;Cardiopulmonary status limiting activity       PT Treatment Interventions DME instruction;Gait training;Stair training;Functional mobility training;Therapeutic activities;Therapeutic exercise;Balance training;Neuromuscular re-education;Patient/family education    PT Goals (Current goals can be found in the Care Plan section)  Acute Rehab PT Goals Patient Stated Goal: to get stronger and home PT Goal Formulation: With patient Time For Goal Achievement: 05/17/19 Potential to Achieve Goals: Good    Frequency Min 3X/week   Barriers to discharge Inaccessible home environment 3 stairs per pt to enter house    Co-evaluation               AM-PAC PT "6 Clicks" Mobility  Outcome Measure Help needed turning from your back to your side while in a flat bed without using bedrails?: None Help needed moving from lying on your back to sitting on the side of a flat bed without using bedrails?: None Help needed moving to and from a bed to a chair (including a wheelchair)?: None Help needed standing up from a chair using your arms (e.g., wheelchair or bedside chair)?: A Little Help needed to walk in hospital room?: A Little Help needed climbing 3-5 steps with a railing? : Total 6 Click Score: 19    End of Session Equipment Utilized During Treatment: Gait belt Activity Tolerance: Treatment limited secondary to medical complications (Comment) Patient left: in bed;with call bell/phone within reach;with nursing/sitter in room Nurse Communication: Mobility  status;Other (comment)(discussed rechecking sats with PT after stairs tomorrow) PT Visit Diagnosis: Dizziness and giddiness (R42);Difficulty in walking, not elsewhere classified (R26.2)    Time: 4680-3212 PT Time Calculation (min) (ACUTE ONLY): 28 min   Charges:   PT Evaluation $PT Eval Moderate Complexity: 1 Mod PT Treatments $Gait Training: 8-22 mins       Ramond Dial 05/10/2019, 12:54 PM   Mee Hives, PT MS Acute Rehab Dept. Number: [place] [facility] [place]

## 2019-05-10 NOTE — Evaluation (Signed)
Occupational Therapy Evaluation Patient Details Name: [name] Sanford MRN: [id number] DOB: 03-10-1955 Today's Date: 05/10/2019    History of Present Illness Pt is a 64 y/o male with PMH of COPD, neck surgery, and chronic pain, presenting for SOB and LE swelling of 2-3 months. Found with elevated BNP and concerning for CHF exacerbation, a fib.    Clinical Impression   PTA patient independent with ADLs, IADLs and mobility, reports using cane intermittently for R knee pain.  Admitted for above and limited by problem list below, including pain and decreased activity tolerance. Pt on 2L supplemental O2 via [place] upon entry SpO2 98%, removed to RA maintained 94% seated but steadily desaturated to 86% with mobility to restroom.  Once seated given cueing for PLB able to recover to 94% on RA, functional mobility back to recliner with cueing for PLB SpO2 maintained 90% and replaced 2L once seated in recliner, 94%. Completing transfers with modified independence, toileting with independence, and LB ADLs with min assist.  He will benefit from further OT services acutely to optimize independence, activity tolerance and use of energy conservation techniques for ADLs, functional mobility but anticipate no further needs after dc home.     Follow Up Recommendations  No OT follow up    Equipment Recommendations  None recommended by OT    Recommendations for Other Services       Precautions / Restrictions Precautions Precautions: Other (comment) Precaution Comments: watch O2 Restrictions Weight Bearing Restrictions: No      Mobility Bed Mobility Overal bed mobility: Independent                Transfers Overall transfer level: Modified independent Equipment used: None             General transfer comment: increased time required    Balance Overall balance assessment: Mild deficits observed, not formally tested                                         ADL either performed  or assessed with clinical judgement   ADL Overall ADL's : Needs assistance/impaired     Grooming: Supervision/safety;Standing   Upper Body Bathing: Set up;Sitting   Lower Body Bathing: Sit to/from stand;Minimal assistance   Upper Body Dressing : Set up;Sitting   Lower Body Dressing: Minimal assistance;Sit to/from stand Lower Body Dressing Details (indicate cue type and reason): requires assist for socks, has been having spouse assist and plans to have her assist at dc if needed Toilet Transfer: Modified Independent;Ambulation;Grab bars   Toileting- Clothing Manipulation and Hygiene: Modified independent;Sit to/from stand       Functional mobility during ADLs: Supervision/safety General ADL Comments: pt limited by pain, decreased activity tolerance     Vision Baseline Vision/History: Wears glasses Wears Glasses: At all times Patient Visual Report: No change from baseline Vision Assessment?: No apparent visual deficits     Perception     Praxis      Pertinent Vitals/Pain Pain Assessment: 0-10 Pain Score: 9  Pain Location: lower back, R LE  Pain Descriptors / Indicators: Discomfort;Guarding;Sharp;Shooting;Pressure Pain Intervention(s): Limited activity within patient's tolerance;Monitored during session;Patient requesting pain meds-RN notified     Hand Dominance Right   Extremity/Trunk Assessment Upper Extremity Assessment Upper Extremity Assessment: Overall WFL for tasks assessed   Lower Extremity Assessment Lower Extremity Assessment: Defer to PT evaluation   Cervical / Trunk Assessment Cervical / Trunk  Assessment: Normal   Communication Communication Communication: No difficulties   Cognition Arousal/Alertness: Awake/alert Behavior During Therapy: WFL for tasks assessed/performed Overall Cognitive Status: Within Functional Limits for tasks assessed                                     General Comments  noted R Knee and back pain, typically  using cane for AM due to stiffness; SpO2 on 2L upon entry 98%,  removed and steadily decreased to 86% with in room mobility on RA but recovers seated with PLB to 94%; seated on 2L upon exit 94%     Exercises     Shoulder Instructions      Home Living Family/patient expects to be discharged to:: Private residence Living Arrangements: Spouse/significant other;Children;Other (Comment)(grandkids ) Available Help at Discharge: Family;Available 24 hours/day Type of Home: House Home Access: Stairs to enter [employer] of Steps: 5 Entrance Stairs-Rails: Right;Left Home Layout: One level     Bathroom Shower/Tub: Occupational psychologist: Standard     Home Equipment: Cane - single point;Walker - 2 wheels;Shower seat;Bedside commode;Hand held shower head;Adaptive equipment Adaptive Equipment: Reacher        Prior Functioning/Environment Level of Independence: Independent with assistive device(s)        Comments: reports using cane as needed for R knee pain (usually just in AM), driving, IADLs        OT Problem List: Decreased activity tolerance;Decreased knowledge of use of DME or AE;Decreased knowledge of precautions;Cardiopulmonary status limiting activity;Pain;Obesity      OT Treatment/Interventions: Self-care/ADL training;Energy conservation;DME and/or AE instruction;Therapeutic activities;Patient/family education    OT Goals(Current goals can be found in the care plan section) Acute Rehab OT Goals Patient Stated Goal: less pain, feel better OT Goal Formulation: With patient Time For Goal Achievement: 05/24/19 Potential to Achieve Goals: Good  OT Frequency: Min 2X/week   Barriers to D/C:            Co-evaluation              AM-PAC OT "6 Clicks" Daily Activity     Outcome Measure Help from another person eating meals?: None Help from another person taking care of personal grooming?: A Little Help from another person toileting, which  includes using toliet, bedpan, or urinal?: None Help from another person bathing (including washing, rinsing, drying)?: A Little Help from another person to put on and taking off regular upper body clothing?: None Help from another person to put on and taking off regular lower body clothing?: A Little 6 Click Score: 21   End of Session Equipment Utilized During Treatment: Oxygen Nurse Communication: Mobility status  Activity Tolerance: Patient tolerated treatment well Patient left: in chair;with call bell/phone within reach;with nursing/sitter in room  OT Visit Diagnosis: Pain;Other (comment)(activity tolerance) Pain - Right/Left: Right Pain - part of body: Knee(back)                Time: 8676-1950 OT Time Calculation (min): 20 min Charges:  OT General Charges $OT Visit: 1 Visit OT Evaluation $OT Eval Low Complexity: 1 Low  Jolaine Artist, OT Acute Rehabilitation Services Pager [phone] Office [phone]   [name] 05/10/2019, 8:31 AM

## 2019-05-10 NOTE — Progress Notes (Signed)
   Subjective:   He is feeling like he is breathing much better today. He urinated a lot after receiving the   Objective:  Vital signs in last 24 hours: Vitals:   05/09/19 1746 05/09/19 2005 05/10/19 0010 05/10/19 0537  BP:  128/85 (!) 147/82 115/60  Pulse:  97 88 73  Resp:  _0 Temp: 98.6 F (37 C) 98.2 F (36.8 C)  97.7 F (36.5 C)  TempSrc:  Oral  Oral  SpO2:  94% 92% 96%  Weight:  130.6 kg  129.8 kg  Height:       Constitution: NAD, sitting up in chair HENT: [place] in place, [place]/AT Cardio: RRR, no m/r/g  Respiratory: +wheezing, rales, or rhonchi, good air movement MSK: moving all extremities Neuro: a&o, normal affect  Skin: c/d/i    Assessment/Plan:  Active Problems:   Acute exacerbation of CHF (congestive heart failure) (HCC)   New Onset Diastolic Heart Failure Exacerbation Paroxysmal Atrial Fibrillation  Echo demonstrates LVH with decreased variability of IVC and increased right atrial pressures. He likely has group three pulmonary hypertension although he was in atrial fibrillation at admission, and it is unclear if this occurred before or after his heart failure exacerbation. [id number] score is 3, he may benefit from outpatient follow-up with the a. Fib clinic here. He is diuresing well with improvement in breathing status after receiving 60 of IV lasix yesterday.   - 40 mg lasix IV bid today  - strict I/o's, daily weights - daily bmp  - discuss anticoagulation therapy tomorrow   COPD - cont. Home inhalers  - prn duonebs  Chronic Pain 2/2 back injury. Cont. Oxycodone 36m q4h prn and hydromorphone ER TB24 qd   Prior to Admission Living Arrangement: home Anticipated Discharge Location: home Dispo: Anticipated discharge pending clinical improvement.   JLadona Horns MD 05/10/2019, 7:50 AM Pager: [phone]

## 2019-05-11 ENCOUNTER — Other Ambulatory Visit: Payer: Self-pay | Admitting: Internal Medicine

## 2019-05-11 DIAGNOSIS — Z720 Tobacco use: Secondary | ICD-10-CM

## 2019-05-11 DIAGNOSIS — I110 Hypertensive heart disease with heart failure: Secondary | ICD-10-CM

## 2019-05-11 DIAGNOSIS — E119 Type 2 diabetes mellitus without complications: Secondary | ICD-10-CM

## 2019-05-11 LAB — BASIC METABOLIC PANEL
Anion gap: 10 (ref 5–15)
BUN: 12 mg/dL (ref 8–23)
CO2: 40 mmol/L — ABNORMAL HIGH (ref 22–32)
Calcium: 8.9 mg/dL (ref 8.9–10.3)
Chloride: 92 mmol/L — ABNORMAL LOW (ref 98–111)
Creatinine, Ser: 1.31 mg/dL — ABNORMAL HIGH (ref 0.61–1.24)
GFR calc Af Amer: >60 mL/min (ref >60)
GFR calc non Af Amer: 58 mL/min — ABNORMAL LOW (ref >60)
Glucose, Bld: 145 mg/dL — ABNORMAL HIGH (ref 70–99)
Potassium: 4.1 mmol/L (ref 3.5–5.1)
Sodium: 142 mmol/L (ref 135–145)

## 2019-05-11 LAB — MAGNESIUM: Magnesium: 2.1 mg/dL (ref 1.7–2.4)

## 2019-05-11 MED ORDER — FUROSEMIDE 10 MG/ML IJ SOLN
80.0000 mg | Freq: Once | INTRAMUSCULAR | Status: AC
  Administered 2019-05-11: 80 mg via INTRAVENOUS
  Filled 2019-05-11: qty 8

## 2019-05-11 MED ORDER — NICOTINE 21 MG/24HR TD PT24: 21.0000 mg | MEDICATED_PATCH | TRANSDERMAL | 1 refills | Status: AC

## 2019-05-11 MED ORDER — FUROSEMIDE 40 MG PO TABS: ORAL_TABLET | ORAL | 0 refills | Status: DC

## 2019-05-11 MED ORDER — FLUMAZENIL 0.5 MG/5ML IV SOLN
INTRAVENOUS | Status: AC
  Filled 2019-05-11: qty 5

## 2019-05-11 MED ORDER — POLYVINYL ALCOHOL 1.4 % OP SOLN
1.0000 [drp] | OPHTHALMIC | Status: DC | PRN
  Filled 2019-05-11: qty 15

## 2019-05-11 MED ORDER — RIVAROXABAN 20 MG PO TABS: 20.0000 mg | ORAL_TABLET | Freq: Every day | ORAL | 0 refills | Status: DC

## 2019-05-11 NOTE — Progress Notes (Signed)
   Subjective:   Feeling great today. Denies shortness of breath. States he walked around the room with therapy with oxygen remaining above 92% and without shortness of breath or chest pain. He urinated a lot overnight.   Discussed pros and cons of long term anticoagulation with short episode of atrial fibrillation, and pt was agreeable to starting Xarelto daily. He really wants to go home. We also discussed that he will need close follow-up with PCP and possibly will need supplemental O2 in the future. Wife was at bedside and endorsed understanding as well.   He would like to quit smoking again and would like to try nicotine patches.   Objective:  Vital signs in last 24 hours: Vitals:   05/10/19 0829 05/10/19 1536 05/10/19 2026 05/11/19 0430  BP:  131/64  112/80  Pulse:  73  84  Resp:  18  20  Temp:  98.2 F (36.8 C)  97.8 F (36.6 C)  TempSrc:  Oral  Oral  SpO2: 98% 97% 99%   Weight:    129.5 kg  Height:       Constitution: NAD, sitting up in bed Cardio: irregular sinus rhythm, no m/r/g, +1 pitting edema Respiratory: CTA, no wheezing rales or rhonchi MSK: moving all extremities  Neuro: a&o, normal affect Skin: c/d/i    Assessment/Plan:  Active Problems:   Acute exacerbation of CHF (congestive heart failure) ([place])  64yo male w/PMH of COPD and chronic pain who presented with shortness of breath, leg swelling and orthopnea with an elevated BNP.   Acute Heart Failure Exacerbation Likely Pulmonary Hypertension 2/2 COPD Continues to diurese well with 2L off overnight, net negative 1L although initial ins/outs first hospital day not accurate. He ambulated with single desaturation to 88% with quick recovery and mostly saturating 91-95% on ambulation, he remained asymptomatic throughout. Discussed with him that he will need repeat OSA testing and may require supplemental oxygen in the future. He is adamant about going home today, which I think is reasonable with his continued  respiratory improvement with diuresis. He will follow-up with his PCP before the end of the week.  - cont. Lasix IV 80 mg today, switch to po tapered dose tomorrow  New Onset paraoxysmal A. Fib Discussed pros and cons of starting anticoagulation therapy for paroxysmal a. Fib, and he is agreeable to start. He will also need referral to a. Fib clinic.   - start xarelto 20 mg qd wc  COPD Cont. Home inahlers   Tobacco Use Nicotine patch   Prior to Admission Living Arrangement: home Anticipated Discharge Location: home Barriers to Discharge: none Dispo: Anticipated discharge today.   Ladona Horns, MD 05/11/2019, 6:49 AM Pager: [phone]

## 2019-05-11 NOTE — Discharge Summary (Addendum)
Name: [name] Sanford MRN: [id number] DOB: 1955/07/20 64 y.o. PCP: Patient, No Pcp Per  Date of Admission: 05/09/2019 10:56 AM Date of Discharge:  05/11/2019 Attending Physician: Lucious Groves, DO  Discharge Diagnosis: Active Problems:   Acute exacerbation of CHF (congestive heart failure) ([place]) Paroxsymal A fib  Discharge Medications: Allergies as of 05/11/2019   Not on File      Medication List     TAKE these medications    albuterol (2.5 MG/3ML) 0.083% nebulizer solution Commonly known as: PROVENTIL Inhale 3 mLs into the lungs every 4 (four) hours as needed for shortness of breath.   esomeprazole 20 MG capsule Commonly known as: NEXIUM Take 20 mg by mouth daily at 12 noon.   furosemide 40 MG tablet Commonly known as: LASIX Take 2 tablets (80 mg total) by mouth daily for 2 days, THEN 1 tablet (40 mg total) daily for 28 days. Start taking on: May 11, 2019   HYDROmorphone HCl ER 16 MG Tb24 Take 1 tablet by mouth daily.   Linzess 290 MCG Caps capsule Generic drug: linaclotide Take 290 mcg by mouth every morning.   nicotine 21 mg/24hr patch Commonly known as: NICODERM CQ - dosed in mg/24 hours Place 1 patch (21 mg total) onto the skin daily.   oxycodone 30 MG immediate release tablet Commonly known as: ROXICODONE Take 30 mg by mouth every 4 (four) hours as needed for pain.   rivaroxaban 20 MG Tabs tablet Commonly known as: Xarelto Take 1 tablet (20 mg total) by mouth daily with supper.   testosterone cypionate 200 MG/ML injection Commonly known as: DEPOTESTOSTERONE CYPIONATE Inject 200 mg into the muscle every 14 (fourteen) days.   Wixela Inhub 250-50 MCG/DOSE Aepb Generic drug: Fluticasone-Salmeterol Inhale 1 puff into the lungs 2 (two) times daily.        Disposition and follow-up:   Mr.[name] Sanford was discharged from [facility] in Stable condition.  At the hospital follow up visit please address:  1.  HFpEF - pt with history  of weight gain, leg swelling, progressive shortness of breath, orthopnea, and PND - echo with LVEF 55-60%, moderate LVH, and increased right atrial pressure (31mHg) - findings likely related to group three pulmonary hypertension from COPD - discharged with 861mPO lasix for 2 days and instructed to continue 4078mhereafter  A fib - pt in a fib on admisison EKG, self converted to sinus rhthym on hospital day 1 - CHA2DS2-VASc score of 3, given CHF, diabetes, and HTN - started Xarelto for anticoagulation - referred to a fib clinic  Tobacco use disorder - prescribed nicotine patches for pt at discharge - stressed importance of tobacco cessation given pt's transient a fib, COPD, and new CHF  Diabetes - admission A1c 7.1 - pt would benefit from starting metformin outpatient   2.  Labs / imaging needed at time of follow-up: none  3.  Pending labs/ test needing follow-up: none  Follow-up Appointments:  Hospital follow-up with PCP in 1 week. Establish with the A fib clinic.  Hospital Course by problem list: 1. [name] a [age]ar old M with significant PMH of COPD and chronic pain, who presented for shortness of breath. Pt with history of ongoing weight gain, leg swelling, and now progressive shortness of breath with orthopnea and PND. Having decreased exertional capacity during this time secondary to dyspnea. Symptoms and presentation at this time consistent with new onset heart failure given volume overload, elevated BNP, and increased  interstitial markings on CXR. Low suspicion for ACS given normal trop x 2. EKG on admission showing a fib. CHA2DS2-VASc score of 3, given CHF, diabetes, and HTN. Echo significant for LVH with decreased variability of IVC and increased right atrial pressures. Pt likely has group three pulmonary hypertension from COPD. Unclear if the atrial fibrillation on admission occurred before or after his heart failure exacerbation. He self converted to sinus rhythm on  hospital day 1. Overall, pt diuresed with IV lasix and was total net negative 1L. Pt requested hospital discharge on hospital day 2. Discussed starting anticoagulation with Xarelto for his transient a fib and follow-up with the a fib clinic. Encouraged pt to abstain from smoking. Also recommended close PCP follow-up for management of his COPD and potentially a repeat sleep study.   Discharge Vitals:   BP 112/80   Pulse 84   Temp 97.8 F (36.6 C) (Oral)   Resp 20   Ht 6' (1.829 m)   Wt 129.5 kg   SpO2 95%   BMI 38.72 kg/m   Pertinent Labs, Studies, and Procedures:  CBC Latest Ref Rng & Units 05/10/2019 05/09/2019 05/05/2010  WBC 4.0 - 10.5 K/uL 12.6(H) 7.5 8.4  Hemoglobin 13.0 - 17.0 g/dL 14.3 14.8 13.5  Hematocrit 39.0 - 52.0 % 45.3 47.0 39.4  Platelets 150 - 400 K/uL 264 216 181   BMP Latest Ref Rng & Units 05/11/2019 05/10/2019 05/09/2019  Glucose 70 - 99 mg/dL 145(H) 147(H) 142(H)  BUN 8 - 23 mg/dL 12 11 5(L)  Creatinine 0.61 - 1.24 mg/dL 1.31(H) 1.17 0.85  Sodium 135 - 145 mmol/L 142 140 137  Potassium 3.5 - 5.1 mmol/L 4.1 4.3 4.1  Chloride 98 - 111 mmol/L 92(L) 94(L) 97(L)  CO2 22 - 32 mmol/L 40(H) 34(H) 30  Calcium 8.9 - 10.3 mg/dL 8.9 9.2 9.0   Lab Results  Component Value Date   HGBA1C 7.1 (H) 05/09/2019   Lab Results  Component Value Date   TSH 0.720 05/09/2019   BNP (last 3 results) Recent Labs    05/09/19 1118  BNP 247.7*    EKG Interpretation  Date/Time:  _0 2.0 lb Date of Birth:  1955-09-12    BSA:          2.466 m Patient Age:    67 years  BP:           128/63 mmHg Patient Gender: M            HR:           99 bpm. Exam Location:  Inpatient Procedure: 2D Echo, Color Doppler and Cardiac Doppler Indications:    I48.91* Unspeicified atrial fibrillation  History:        Patient has no prior history of Echocardiogram examinations.  Sonographer:    Merrie Roof RDCS Referring Phys: [id number] [place]  1. Poor acoustic windows limit study.  2. Left ventricular ejection fraction, by estimation, is 55 to 60%. The left ventricle has normal function. The left ventricle has no regional wall motion abnormalities. There is moderate left ventricular hypertrophy. Left ventricular diastolic parameters are indeterminate.  3. Right ventricular systolic function is normal. The right ventricular size is normal.  4. The mitral valve is abnormal. No evidence of mitral valve regurgitation.  5. The aortic valve is normal in structure. Aortic valve regurgitation is not visualized.  6. The inferior vena cava is dilated in size with <50% respiratory variability, suggesting right atrial pressure of 15 mmHg. FINDINGS  Left Ventricle: Left ventricular ejection fraction, by estimation, is 55 to 60%. The left ventricle has normal function. The left ventricle has no regional  wall motion abnormalities. The left ventricular internal cavity size was normal in size. There is  moderate left ventricular hypertrophy. Left ventricular diastolic parameters are indeterminate. Right Ventricle: The right ventricular size is normal. No increase in right ventricular wall thickness. Right ventricular systolic function is normal. Left Atrium: Left atrial size was normal in size. Right Atrium: Right atrial size was normal in size. Pericardium: There is no evidence of pericardial effusion. Mitral Valve: The mitral valve is abnormal. There is mild thickening of the mitral valve leaflet(s). No evidence of mitral valve regurgitation. Tricuspid Valve: The tricuspid valve is normal in structure. Tricuspid valve regurgitation is trivial. Aortic Valve: The aortic valve is normal in structure. Aortic valve regurgitation is not visualized. Pulmonic Valve: The pulmonic valve was not well visualized. Pulmonic valve regurgitation is not visualized. Aorta: The aortic root is normal in size and structure. Venous: The inferior vena cava is dilated in size with less than 50% respiratory variability, suggesting right atrial pressure of 15 mmHg. IAS/Shunts: No atrial level shunt detected by color flow Doppler.  LEFT VENTRICLE PLAX 2D LVIDd:         3.90 cm LVIDs:         2.60 cm LV PW:         1.50 cm LV IVS:        1.50 cm LVOT diam:     1.90 cm LV SV:         62 LV SV Index:   25 LVOT Area:     2.84 cm  LV Volumes (MOD) LV vol d, MOD A4C: 117.0 ml LV vol s, MOD A4C: 53.8 ml LV SV MOD A4C:     117.0 ml RIGHT VENTRICLE             IVC RV Basal diam:  4.20 cm     IVC diam: 2.60 cm RV Mid diam:    3.90 cm RV S prime:     14.00 cm/s TAPSE (M-mode): 2.3 cm LEFT ATRIUM              Index       RIGHT ATRIUM  Index LA diam:        3.60 cm  1.46 cm/m  RA Area:     27.20 cm LA Vol (A2C):   115.0 ml 46.64 ml/m RA Volume:   97.20 ml  39.42 ml/m LA Vol (A4C):   50.5 ml  20.48 ml/m LA Biplane Vol: 82.2 ml  33.34 ml/m   AORTIC VALVE LVOT Vmax:   107.00 cm/s LVOT Vmean:  82.900 cm/s LVOT VTI:    0.217 m  AORTA Ao Root diam: 2.60 cm  SHUNTS Systemic VTI:  0.22 m Systemic Diam: 1.90 cm Dorris Carnes MD Electronically signed by Dorris Carnes MD Signature Date/Time: 05/09/2019/11:38:15 PM    Final    Discharge Instructions: Discharge Instructions     Diet - low sodium heart healthy   Complete by: As directed    Discharge instructions   Complete by: As directed    Please follow up with your primary care doctor later this week.   I have placed a referral to the Atrial Fibrillation Clinic at [facility].  They will call to make an appointment.  If you do not hear from them call [phone]  Please START taking   Lasix 40 mg - take 2 tablets in the morning for two days THEN Take 1 tablet per day in the morning after that.  Xarelto 20 mg - take 1 tablet per day in the evening with a meal.  Please make sure to eat this medication with food as it increases absorption.   I have also ordered nicotine patches - use the 21 mg patch for six weeks, then decrease to the 14 mg patch which your doctor can order.   Increase activity slowly   Complete by: As directed        Signed: Ladona Horns, MD 05/11/2019, 11:26 AM   Pager: [phone]

## 2019-05-11 NOTE — Progress Notes (Signed)
Occupational Therapy Treatment Patient Details Name: [name] Sanford MRN: [id number] DOB: [birth year] Today's Date: 05/11/2019    History of present illness Pt is a 64 y/o male with PMH of COPD, neck surgery, and chronic pain, presenting for SOB and LE swelling of 2-3 months. Found with elevated BNP and concerning for CHF exacerbation, a fib.    OT comments  Patient progressing well. On RA during session with SpO2 91% at rest upon entry.  Discussed energy conservation techniques to optimize independence and building up tolerance for daily routines;pt verbalized understanding.  Completing grooming at sink with independence, standing.  Functional mobility for activity tolerance with desaturation to 88%, but recovers quickly with standing rest break and cueing for PLB to >90%; with focused PLB and functional mobility SpO2 maintained 91-95%.  Reinforced importance of PLB during mobility and ADL tasks, pt voiced understanding.  DC plan remains appropriate. Will follow acutely.    Follow Up Recommendations  No OT follow up    Equipment Recommendations  None recommended by OT    Recommendations for Other Services      Precautions / Restrictions Precautions Precautions: Fall Precaution Comments: monitor O2 sats Restrictions Weight Bearing Restrictions: No       Mobility Bed Mobility Overal bed mobility: Modified Independent             General bed mobility comments: HOB elevated  Transfers Overall transfer level: Independent               General transfer comment: no assist required    Balance Overall balance assessment: Mild deficits observed, not formally tested                                         ADL either performed or assessed with clinical judgement   ADL Overall ADL's : Needs assistance/impaired     Grooming: Independent;Standing       Lower Body Bathing: Modified independent;Sit to/from stand Lower Body Bathing Details (indicate cue  type and reason): patient educated on use of long sponge and techniques for energy conservation during bathing ( seated as able--reports having [place], luke warm water and leaving fan on/door open; pt reports using long sponge PTA for feet)          Toilet Transfer: Modified Independent;Ambulation Toilet Transfer Details (indicate cue type and reason): simulated in room          Functional mobility during ADLs: Modified independent General ADL Comments: pt remains limited by decreased activity tolerance      Vision       Perception     Praxis      Cognition Arousal/Alertness: Awake/alert Behavior During Therapy: WFL for tasks assessed/performed Overall Cognitive Status: Within Functional Limits for tasks assessed                                          Exercises     Shoulder Instructions       General Comments reviewed energy conservation techniques to incoporate into daily routine; reviewed PLB techniques; pt on RA at 91% upon entry, slight drop to 88% with mobility but with standing rest break and PLB able to recover quickly to >90%, focused PLB for functional mobility ranged 91-95% SpO2 on RA     Pertinent Vitals/ Pain  Pain Assessment: Faces Faces Pain Scale: Hurts even more Pain Location: lower back, R LE  Pain Descriptors / Indicators: Grimacing;Guarding Pain Intervention(s): Limited activity within patient's tolerance;Monitored during session;Repositioned;RN gave pain meds during session  Home Living                                          Prior Functioning/Environment              Frequency  Min 2X/week        Progress Toward Goals  OT Goals(current goals can now be found in the care plan section)  Progress towards OT goals: Progressing toward goals  Acute Rehab OT Goals Patient Stated Goal: to get stronger and home OT Goal Formulation: With patient  Plan Discharge plan remains appropriate;Frequency  remains appropriate    Co-evaluation                 AM-PAC OT "6 Clicks" Daily Activity     Outcome Measure   Help from another person eating meals?: None Help from another person taking care of personal grooming?: None Help from another person toileting, which includes using toliet, bedpan, or urinal?: None Help from another person bathing (including washing, rinsing, drying)?: None Help from another person to put on and taking off regular upper body clothing?: None Help from another person to put on and taking off regular lower body clothing?: A Little 6 Click Score: 23    End of Session    OT Visit Diagnosis: Pain;Other (comment)(activity tolerance ) Pain - Right/Left: Right Pain - part of body: Knee(back)   Activity Tolerance Patient tolerated treatment well   Patient Left with call bell/phone within reach;Other (comment)(seated EOB )   Nurse Communication Mobility status        Time: 2446-2863 OT Time Calculation (min): 18 min  Charges: OT Treatments $Self Care/Home Management : 8-22 mins  Jolaine Artist, OT Acute Rehabilitation Services Pager [phone] Office [phone]    [name] 05/11/2019, 9:12 AM

## 2019-05-13 DIAGNOSIS — M25561 Pain in right knee: Secondary | ICD-10-CM

## 2019-05-13 HISTORY — DX: Pain in right knee: M25.561

## 2019-05-18 ENCOUNTER — Ambulatory Visit (HOSPITAL_COMMUNITY)
Admit: 2019-05-18 | Discharge: 2019-05-18 | Disposition: A | Discharge status: Home / Self Care | Payer: Medicare Other | Attending: Physician Assistant | Admitting: Physician Assistant

## 2019-05-18 ENCOUNTER — Other Ambulatory Visit: Payer: Self-pay

## 2019-05-18 VITALS — BP 136/68 | HR 79 | Ht 72.0 in | Wt 287.4 lb

## 2019-05-18 DIAGNOSIS — I5030 Unspecified diastolic (congestive) heart failure: Secondary | ICD-10-CM | POA: Insufficient documentation

## 2019-05-18 DIAGNOSIS — Z6838 Body mass index (BMI) 38.0-38.9, adult: Secondary | ICD-10-CM | POA: Diagnosis not present

## 2019-05-18 DIAGNOSIS — E669 Obesity, unspecified: Secondary | ICD-10-CM | POA: Insufficient documentation

## 2019-05-18 DIAGNOSIS — F17200 Nicotine dependence, unspecified, uncomplicated: Secondary | ICD-10-CM | POA: Diagnosis not present

## 2019-05-18 DIAGNOSIS — J449 Chronic obstructive pulmonary disease, unspecified: Secondary | ICD-10-CM | POA: Insufficient documentation

## 2019-05-18 DIAGNOSIS — D6869 Other thrombophilia: Secondary | ICD-10-CM

## 2019-05-18 DIAGNOSIS — I480 Paroxysmal atrial fibrillation: Secondary | ICD-10-CM | POA: Insufficient documentation

## 2019-05-18 DIAGNOSIS — Z7901 Long term (current) use of anticoagulants: Secondary | ICD-10-CM | POA: Diagnosis not present

## 2019-05-18 DIAGNOSIS — I110 Hypertensive heart disease with heart failure: Secondary | ICD-10-CM | POA: Insufficient documentation

## 2019-05-18 DIAGNOSIS — Z79899 Other long term (current) drug therapy: Secondary | ICD-10-CM | POA: Diagnosis not present

## 2019-05-18 DIAGNOSIS — I4892 Unspecified atrial flutter: Secondary | ICD-10-CM

## 2019-05-18 HISTORY — DX: Paroxysmal atrial fibrillation: I48.0

## 2019-05-18 HISTORY — DX: Other thrombophilia: D68.69

## 2019-05-18 HISTORY — DX: Unspecified atrial flutter: I48.92

## 2019-05-18 NOTE — Progress Notes (Signed)
Primary Care Physician: Patient, No Pcp Per Primary Cardiologist: Dr Agustin Cree Primary Electrophysiologist: none Referring Physician: Dr Ronnald Ramp   [name] Sanford is a 64 y.o. male with a history of COPD, HTN, DM, tobacco abuse, diastolic CHF, and paroxysmal atrial fibrillation who presents for consultation in the [facility].  The patient was initially diagnosed with atrial fibrillation on 05/09/19 after presenting to the ED with symptoms of gradually worsening symptoms of SOB. He was found to be acutely fluid overloaded EF normal 55-60% and was admitted. Patient was started on Xarelto for a CHADS2VASC score of 3. Patient converted spontaneously to SR the night of 3/21. He reports that he has felt well until 05/17/19 when he had symptoms of fatigue. His is in rate controlled atrial flutter today. He denies significant alcohol use. He does admit to witnessed apnea and daytime somnolence. He denies any bleeding issues on anticoagulation.   Today, he denies symptoms of palpitations, chest pain, orthopnea, PND, dizziness, presyncope, syncope, bleeding, or neurologic sequela. The patient is tolerating medications without difficulties and is otherwise without complaint today.    Atrial Fibrillation Risk Factors:  he does have symptoms or diagnosis of sleep apnea. he does not have a history of rheumatic fever. he does not have a history of alcohol use. The patient does not have a history of early familial atrial fibrillation or other arrhythmias.  he has a BMI of Body mass index is 38.98 kg/m.Marland Kitchen Filed Weights   05/18/19 1440  Weight: 130.4 kg    No family history on file.   Atrial Fibrillation Management history:  Previous antiarrhythmic drugs: none Previous cardioversions: none Previous ablations: none CHADS2VASC score: 3 Anticoagulation history: Xarelto    Past Medical History:  Diagnosis Date  . Arthritis   . Back pain   . COPD (chronic obstructive  pulmonary disease) ([place])    Past Surgical History:  Procedure Laterality Date  . BRAIN SURGERY    . NECK SURGERY    . SHOULDER SURGERY    . SPINAL CORD STIMULATOR IMPLANT      Current Outpatient Medications  Medication Sig Dispense Refill  . albuterol (PROVENTIL) (2.5 MG/3ML) 0.083% nebulizer solution Inhale 3 mLs into the lungs every 4 (four) hours as needed for shortness of breath.    . ATROVENT HFA 17 MCG/ACT inhaler SMARTSIG:2 Puff(s) By Mouth 4 Times Daily    . esomeprazole (NEXIUM) 20 MG capsule Take 20 mg by mouth daily at 12 noon.    . furosemide (LASIX) 40 MG tablet Take 2 tablets (80 mg total) by mouth daily for 2 days, THEN 1 tablet (40 mg total) daily for 28 days. (Patient taking differently: Taking one tablet by mouth daily) 32 tablet 0  . HYDROmorphone HCl ER 16 MG TB24 Take 1 tablet by mouth daily.    Marland Kitchen LINZESS 290 MCG CAPS capsule Take 290 mcg by mouth every morning.    . nicotine (NICODERM CQ - DOSED IN MG/24 HOURS) 21 mg/24hr patch Place 1 patch (21 mg total) onto the skin daily. 42 patch 1  . oxycodone (ROXICODONE) 30 MG immediate release tablet Take 30 mg by mouth every 4 (four) hours as needed for pain.    . rivaroxaban (XARELTO) 20 MG TABS tablet Take 1 tablet (20 mg total) by mouth daily with supper. 30 tablet 0  . testosterone cypionate (DEPOTESTOSTERONE CYPIONATE) 200 MG/ML injection Inject 200 mg into the muscle every 14 (fourteen) days.    Grant Ruts INHUB 250-50 MCG/DOSE AEPB  Inhale 1 puff into the lungs 2 (two) times daily.     No current facility-administered medications for this encounter.    Allergies  Allergen Reactions  . Oxymorphone Other (See Comments)    Flu like symptoms, oral blisters    Social History   Socioeconomic History  . Marital status: Married    Spouse name: Not on file  . Number of children: Not on file  . Years of education: Not on file  . Highest education level: Not on file  Occupational History  . Not on file  Tobacco Use    . Smoking status: Current Every Day Smoker  . Smokeless tobacco: Never Used  Substance and Sexual Activity  . Alcohol use: Not Currently  . Drug use: Never  . Sexual activity: Not on file  Other Topics Concern  . Not on file  Social History Narrative  . Not on file   Social Determinants of Health   Financial Resource Strain:   . Difficulty of Paying Living Expenses:   Food Insecurity:   . Worried About Charity fundraiser in the Last Year:   . Arboriculturist in the Last Year:   Transportation Needs:   . Film/video editor (Medical):   Marland Kitchen Lack of Transportation (Non-Medical):   Physical Activity:   . Days of Exercise per Week:   . Minutes of Exercise per Session:   Stress:   . Feeling of Stress :   Social Connections:   . Frequency of Communication with Friends and Family:   . Frequency of Social Gatherings with Friends and Family:   . Attends Religious Services:   . Active Member of Clubs or Organizations:   . Attends Archivist Meetings:   Marland Kitchen Marital Status:   Intimate Partner Violence:   . Fear of Current or Ex-Partner:   . Emotionally Abused:   Marland Kitchen Physically Abused:   . Sexually Abused:      ROS- All systems are reviewed and negative except as per the HPI above.  Physical Exam: Vitals:   05/18/19 1440  BP: 136/68  Pulse: 79  SpO2: 96%  Weight: 130.4 kg  Height: 6' (1.829 m)    GEN- The patient is well appearing obese male, alert and oriented x 3 today.   Head- normocephalic, atraumatic Eyes-  Sclera clear, conjunctiva pink Ears- hearing intact Oropharynx- clear Neck- supple  Lungs- Clear to ausculation bilaterally, normal work of breathing Heart- irregular rate and rhythm, no murmurs, rubs or gallops  GI- soft, NT, ND, + BS Extremities- no clubbing, cyanosis, or edema MS- no significant deformity or atrophy Skin- no rash or lesion Psych- euthymic mood, full affect Neuro- strength and sensation are intact  Wt Readings from Last 3  Encounters:  05/18/19 130.4 kg  05/11/19 129.5 kg    EKG today demonstrates atrial flutter (typical?) with variable block, HR 79, QRS 82, QTc 419  Echo 05/09/19 demonstrated  1. Poor acoustic windows limit study.  2. Left ventricular ejection fraction, by estimation, is 55 to 60%. The  left ventricle has normal function. The left ventricle has no regional  wall motion abnormalities. There is moderate left ventricular hypertrophy.  Left ventricular diastolic parameters are indeterminate.  3. Right ventricular systolic function is normal. The right ventricular  size is normal.  4. The mitral valve is abnormal. No evidence of mitral valve  regurgitation.  5. The aortic valve is normal in structure. Aortic valve regurgitation is  not visualized.  6.  The inferior vena cava is dilated in size with <50% respiratory  variability, suggesting right atrial pressure of 15 mmHg.   LA volume 115  Epic records are reviewed at length today  CHA2DS2-VASc Score = 3 The patient's score is based upon: CHF History: Yes HTN History: Yes Age : < 65 Diabetes History: Yes Stroke History: No Vascular Disease History: No Gender: Male      ASSESSMENT AND PLAN: 1. Paroxysmal Atrial Fibrillation (ICD10:  I48.0) The patient's CHA2DS2-VASc score is 3, indicating a 3.2% annual risk of stroke.   General education about afib provided and questions answered. We also discussed his stroke risk and the risks and benefits of anticoagulation.  We discussed therapeutic options today including AAD therapy. Would avoid class IC given moderate LVH, and avoid Multaq with CHF. After discussing the risks and benefits, will plan for dofetilide admission. QTc in SR 422 ms. Will forward medication list to PharmD to screen for QT prolonging drugs. Patient will check on price of medication. Recent Bmet/mag reviewed.  Will not add rate control at this time given he has been rate controlled even in afib.  Continue Xarelto  20 mg daily (started 05/11/19). Patient denies any missed doses.  Lifestyle modification was discussed as below and including smoking cessation.  2. Secondary Hypercoagulable State (ICD10:  D68.69) The patient is at significant risk for stroke/thromboembolism based upon his CHA2DS2-VASc Score of 3.  Continue Rivaroxaban (Xarelto).   3. Obesity Body mass index is 38.98 kg/m. Lifestyle modification was discussed at length including regular exercise and weight reduction.  4. Witnessed apnea/daytime somnolence The importance of adequate treatment of sleep apnea was discussed today in order to improve our ability to maintain sinus rhythm long term. Will refer for sleep study.  5. Diastolic CHF Patient reports his weight is stable. No changes today.  6. HTN Stable, no changes today.   Follow up for dofetilide admission after 3 weeks of uninterrupted anticoagulation. Will have him follow up with Dr Agustin Cree in 2-3 months.    [facility] [address] [place], [place] [address] [phone] 05/18/2019 2:56 PM

## 2019-06-04 ENCOUNTER — Other Ambulatory Visit (HOSPITAL_COMMUNITY): Payer: Self-pay | Admitting: *Deleted

## 2019-06-04 ENCOUNTER — Other Ambulatory Visit (HOSPITAL_COMMUNITY)
Admission: RE | Admit: 2019-06-04 | Discharge: 2019-06-04 | Disposition: A | Discharge status: Home / Self Care | Payer: Medicare Other | Source: Ambulatory Visit | Attending: Nurse Practitioner | Admitting: Nurse Practitioner

## 2019-06-04 ENCOUNTER — Telehealth: Payer: Self-pay | Admitting: Pharmacist

## 2019-06-04 DIAGNOSIS — Z01812 Encounter for preprocedural laboratory examination: Secondary | ICD-10-CM | POA: Diagnosis present

## 2019-06-04 DIAGNOSIS — R0681 Apnea, not elsewhere classified: Secondary | ICD-10-CM

## 2019-06-04 DIAGNOSIS — Z20822 Contact with and (suspected) exposure to covid-19: Secondary | ICD-10-CM | POA: Insufficient documentation

## 2019-06-04 LAB — SARS CORONAVIRUS 2 (TAT 6-24 HRS): SARS Coronavirus 2: NEGATIVE

## 2019-06-04 NOTE — Telephone Encounter (Signed)
Medication list reviewed in anticipation of upcoming Tikosyn initiation. Patient is not taking any contraindicated or QTc prolonging medications.   Patient is anticoagulated on Xarelto on the appropriate dose. Please ensure that patient has not missed any anticoagulation doses in the 3 weeks prior to Tikosyn initiation.   Patient will need to be counseled to avoid use of Benadryl while on Tikosyn and in the 2-3 days prior to Tikosyn initiation.  Please ensure K is at least 4 and Mg is at least 2.

## 2019-06-07 ENCOUNTER — Other Ambulatory Visit (HOSPITAL_COMMUNITY): Payer: Self-pay | Admitting: *Deleted

## 2019-06-07 MED ORDER — FUROSEMIDE 40 MG PO TABS: 40.0000 mg | ORAL_TABLET | Freq: Every day | ORAL | 3 refills | Status: DC

## 2019-06-08 ENCOUNTER — Inpatient Hospital Stay (HOSPITAL_COMMUNITY)
Admission: AD | Admit: 2019-06-08 | Discharge: 2019-06-11 | DRG: 308 | Disposition: A | Discharge status: Home / Self Care | Payer: Medicare Other | Source: Ambulatory Visit | Attending: Internal Medicine | Admitting: Internal Medicine

## 2019-06-08 ENCOUNTER — Encounter (HOSPITAL_COMMUNITY): Payer: Self-pay | Admitting: Internal Medicine

## 2019-06-08 ENCOUNTER — Other Ambulatory Visit: Payer: Self-pay

## 2019-06-08 ENCOUNTER — Ambulatory Visit (HOSPITAL_COMMUNITY)
Admission: RE | Admit: 2019-06-08 | Discharge: 2019-06-08 | Disposition: A | Discharge status: Home / Self Care | Payer: Medicare Other | Source: Ambulatory Visit | Attending: Nurse Practitioner | Admitting: Nurse Practitioner

## 2019-06-08 VITALS — BP 140/68 | HR 87 | Ht 72.0 in | Wt 287.4 lb

## 2019-06-08 DIAGNOSIS — I5033 Acute on chronic diastolic (congestive) heart failure: Secondary | ICD-10-CM | POA: Diagnosis not present

## 2019-06-08 DIAGNOSIS — D6869 Other thrombophilia: Secondary | ICD-10-CM | POA: Diagnosis present

## 2019-06-08 DIAGNOSIS — Z5181 Encounter for therapeutic drug level monitoring: Secondary | ICD-10-CM | POA: Insufficient documentation

## 2019-06-08 DIAGNOSIS — Z6841 Body Mass Index (BMI) 40.0 and over, adult: Secondary | ICD-10-CM | POA: Diagnosis not present

## 2019-06-08 DIAGNOSIS — J449 Chronic obstructive pulmonary disease, unspecified: Secondary | ICD-10-CM | POA: Diagnosis present

## 2019-06-08 DIAGNOSIS — Z79899 Other long term (current) drug therapy: Secondary | ICD-10-CM | POA: Diagnosis not present

## 2019-06-08 DIAGNOSIS — Z7901 Long term (current) use of anticoagulants: Secondary | ICD-10-CM | POA: Diagnosis not present

## 2019-06-08 DIAGNOSIS — I4819 Other persistent atrial fibrillation: Secondary | ICD-10-CM

## 2019-06-08 DIAGNOSIS — E119 Type 2 diabetes mellitus without complications: Secondary | ICD-10-CM | POA: Diagnosis present

## 2019-06-08 DIAGNOSIS — F17210 Nicotine dependence, cigarettes, uncomplicated: Secondary | ICD-10-CM | POA: Diagnosis present

## 2019-06-08 DIAGNOSIS — E669 Obesity, unspecified: Secondary | ICD-10-CM | POA: Diagnosis present

## 2019-06-08 DIAGNOSIS — I110 Hypertensive heart disease with heart failure: Secondary | ICD-10-CM | POA: Diagnosis present

## 2019-06-08 HISTORY — DX: Unspecified atrial fibrillation: I48.91

## 2019-06-08 HISTORY — DX: Other long term (current) drug therapy: Z51.81

## 2019-06-08 HISTORY — DX: Other persistent atrial fibrillation: I48.19

## 2019-06-08 HISTORY — DX: Other long term (current) drug therapy: Z79.899

## 2019-06-08 LAB — BASIC METABOLIC PANEL
Anion gap: 10 (ref 5–15)
BUN: 9 mg/dL (ref 8–23)
CO2: 36 mmol/L — ABNORMAL HIGH (ref 22–32)
Calcium: 9.2 mg/dL (ref 8.9–10.3)
Chloride: 93 mmol/L — ABNORMAL LOW (ref 98–111)
Creatinine, Ser: 1.29 mg/dL — ABNORMAL HIGH (ref 0.61–1.24)
GFR calc Af Amer: >60 mL/min (ref >60)
GFR calc non Af Amer: 59 mL/min — ABNORMAL LOW (ref >60)
Glucose, Bld: 182 mg/dL — ABNORMAL HIGH (ref 70–99)
Potassium: 4.3 mmol/L (ref 3.5–5.1)
Sodium: 139 mmol/L (ref 135–145)

## 2019-06-08 LAB — MAGNESIUM: Magnesium: 2.1 mg/dL (ref 1.7–2.4)

## 2019-06-08 MED ORDER — RIVAROXABAN 20 MG PO TABS
20.0000 mg | ORAL_TABLET | Freq: Every day | ORAL | Status: DC
  Administered 2019-06-08 – 2019-06-10 (×3): 20 mg via ORAL
  Filled 2019-06-08 (×3): qty 1

## 2019-06-08 MED ORDER — DOFETILIDE 500 MCG PO CAPS
500.0000 ug | ORAL_CAPSULE | Freq: Two times a day (BID) | ORAL | Status: DC
  Administered 2019-06-08 – 2019-06-11 (×6): 500 ug via ORAL
  Filled 2019-06-08 (×6): qty 1

## 2019-06-08 MED ORDER — IPRATROPIUM BROMIDE 0.02 % IN SOLN
0.5000 mg | Freq: Four times a day (QID) | RESPIRATORY_TRACT | Status: DC
  Administered 2019-06-09 – 2019-06-11 (×8): 0.5 mg via RESPIRATORY_TRACT
  Filled 2019-06-08 (×10): qty 2.5

## 2019-06-08 MED ORDER — FUROSEMIDE 40 MG PO TABS: 40.0000 mg | ORAL_TABLET | Freq: Every day | ORAL | Status: DC

## 2019-06-08 MED ORDER — HYDROMORPHONE HCL ER 8 MG PO T24A
16.0000 mg | EXTENDED_RELEASE_TABLET | Freq: Every day | ORAL | Status: DC
  Administered 2019-06-09 – 2019-06-11 (×3): 16 mg via ORAL
  Filled 2019-06-08 (×3): qty 2

## 2019-06-08 MED ORDER — PANTOPRAZOLE SODIUM 20 MG PO TBEC: 40.0000 mg | DELAYED_RELEASE_TABLET | Freq: Every day | ORAL | Status: DC

## 2019-06-08 MED ORDER — SODIUM CHLORIDE 0.9% FLUSH: 3.0000 mL | INTRAVENOUS | Status: DC | PRN

## 2019-06-08 MED ORDER — ALBUTEROL SULFATE (2.5 MG/3ML) 0.083% IN NEBU: 3.0000 mL | INHALATION_SOLUTION | RESPIRATORY_TRACT | Status: DC | PRN

## 2019-06-08 MED ORDER — LINACLOTIDE 145 MCG PO CAPS
290.0000 ug | ORAL_CAPSULE | Freq: Every morning | ORAL | Status: DC
  Administered 2019-06-09 – 2019-06-11 (×3): 290 ug via ORAL
  Filled 2019-06-08 (×2): qty 2
  Filled 2019-06-08: qty 1
  Filled 2019-06-08: qty 2

## 2019-06-08 MED ORDER — SODIUM CHLORIDE 0.9% FLUSH
3.0000 mL | Freq: Two times a day (BID) | INTRAVENOUS | Status: DC
  Administered 2019-06-09: 3 mL via INTRAVENOUS

## 2019-06-08 MED ORDER — NICOTINE 21 MG/24HR TD PT24
21.0000 mg | MEDICATED_PATCH | TRANSDERMAL | Status: DC
  Administered 2019-06-09: 21 mg via TRANSDERMAL

## 2019-06-08 MED ORDER — SODIUM CHLORIDE 0.9 % IV SOLN: 250.0000 mL | INTRAVENOUS | Status: DC | PRN

## 2019-06-08 MED ORDER — IPRATROPIUM BROMIDE HFA 17 MCG/ACT IN AERS: 2.0000 | INHALATION_SPRAY | Freq: Four times a day (QID) | RESPIRATORY_TRACT | Status: DC

## 2019-06-08 MED ORDER — SODIUM CHLORIDE 0.9% FLUSH
10.0000 mL | Freq: Two times a day (BID) | INTRAVENOUS | Status: DC
  Administered 2019-06-08 – 2019-06-10 (×3): 10 mL

## 2019-06-08 MED ORDER — SODIUM CHLORIDE 0.9% FLUSH: 10.0000 mL | INTRAVENOUS | Status: DC | PRN

## 2019-06-08 MED ORDER — OXYCODONE HCL 5 MG PO TABS
30.0000 mg | ORAL_TABLET | ORAL | Status: DC | PRN
  Administered 2019-06-08 – 2019-06-11 (×16): 30 mg via ORAL
  Filled 2019-06-08 (×16): qty 6

## 2019-06-08 MED ORDER — IPRATROPIUM BROMIDE 0.02 % IN SOLN
RESPIRATORY_TRACT | Status: AC
  Administered 2019-06-08: 0.5 mg via RESPIRATORY_TRACT
  Filled 2019-06-08: qty 2.5

## 2019-06-08 MED ORDER — FLUTICASONE FUROATE-VILANTEROL 200-25 MCG/INH IN AEPB
1.0000 | INHALATION_SPRAY | Freq: Every day | RESPIRATORY_TRACT | Status: DC
  Administered 2019-06-09 – 2019-06-11 (×3): 1 via RESPIRATORY_TRACT
  Filled 2019-06-08: qty 28

## 2019-06-08 NOTE — Progress Notes (Addendum)
Primary Care Physician: Patient, No Pcp Per Primary Cardiologist: Dr Agustin Cree Primary Electrophysiologist: none Referring Physician: Dr Ronnald Ramp   [name] Sanford is a 64 y.o. male with a history of COPD, HTN, DM, tobacco abuse, diastolic CHF, and paroxysmal atrial fibrillation who presents for follow up in the [facility].  The patient was initially diagnosed with atrial fibrillation on 05/09/19 after presenting to the ED with symptoms of gradually worsening symptoms of SOB. He was found to be acutely fluid overloaded EF normal 55-60% and was admitted. Patient was started on Xarelto for a CHADS2VASC score of 3. Patient converted spontaneously to SR the night of 3/21. He reports that he has felt well until 05/17/19 when he had symptoms of fatigue. He denies significant alcohol use. He does admit to witnessed apnea and daytime somnolence. He denies any bleeding issues on anticoagulation.   On follow up today, he presents for dofetilide admission. Patient reports doing well since his last visit. He denies any missed doses of anticoagulation.   Today, he denies symptoms of palpitations, chest pain, orthopnea, PND, dizziness, presyncope, syncope, bleeding, or neurologic sequela. The patient is tolerating medications without difficulties and is otherwise without complaint today.    Atrial Fibrillation Risk Factors:  he does have symptoms or diagnosis of sleep apnea. he does not have a history of rheumatic fever. he does not have a history of alcohol use. The patient does not have a history of early familial atrial fibrillation or other arrhythmias.  he has a BMI of Body mass index is 38.98 kg/m.Marland Kitchen Filed Weights   06/08/19 1136  Weight: 130.4 kg    No family history on file.   Atrial Fibrillation Management history:  Previous antiarrhythmic drugs: none Previous cardioversions: none Previous ablations: none CHADS2VASC score: 3 Anticoagulation history: Xarelto     Past Medical History:  Diagnosis Date  . Arthritis   . Back pain   . COPD (chronic obstructive pulmonary disease) ([place])    Past Surgical History:  Procedure Laterality Date  . BRAIN SURGERY    . NECK SURGERY    . SHOULDER SURGERY    . SPINAL CORD STIMULATOR IMPLANT      Current Outpatient Medications  Medication Sig Dispense Refill  . albuterol (PROVENTIL) (2.5 MG/3ML) 0.083% nebulizer solution Inhale 3 mLs into the lungs every 4 (four) hours as needed for shortness of breath.    . ATROVENT HFA 17 MCG/ACT inhaler SMARTSIG:2 Puff(s) By Mouth 4 Times Daily    . esomeprazole (NEXIUM) 20 MG capsule Take 20 mg by mouth daily at 12 noon.    . furosemide (LASIX) 40 MG tablet Take 1 tablet (40 mg total) by mouth daily. 30 tablet 3  . HYDROmorphone HCl ER 16 MG TB24 Take 1 tablet by mouth daily.    Marland Kitchen LINZESS 290 MCG CAPS capsule Take 290 mcg by mouth every morning.    . mometasone (ELOCON) 0.1 % cream     . nicotine (NICODERM CQ - DOSED IN MG/24 HOURS) 21 mg/24hr patch Place 1 patch (21 mg total) onto the skin daily. 42 patch 1  . oxycodone (ROXICODONE) 30 MG immediate release tablet Take 30 mg by mouth every 4 (four) hours as needed for pain.    . rivaroxaban (XARELTO) 20 MG TABS tablet Take 1 tablet (20 mg total) by mouth daily with supper. 30 tablet 0  . testosterone cypionate (DEPOTESTOSTERONE CYPIONATE) 200 MG/ML injection Inject 200 mg into the muscle every 14 (fourteen) days.    Marland Kitchen  WIXELA INHUB 250-50 MCG/DOSE AEPB Inhale 1 puff into the lungs 2 (two) times daily.     No current facility-administered medications for this encounter.    Allergies  Allergen Reactions  . Oxymorphone Other (See Comments)    Flu like symptoms, oral blisters    Social History   Socioeconomic History  . Marital status: Married    Spouse name: Not on file  . Number of children: Not on file  . Years of education: Not on file  . Highest education level: Not on file  Occupational History  . Not  on file  Tobacco Use  . Smoking status: Current Every Day Smoker  . Smokeless tobacco: Never Used  Substance and Sexual Activity  . Alcohol use: Not Currently  . Drug use: Never  . Sexual activity: Not on file  Other Topics Concern  . Not on file  Social History Narrative  . Not on file   Social Determinants of Health   Financial Resource Strain:   . Difficulty of Paying Living Expenses:   Food Insecurity:   . Worried About Charity fundraiser in the Last Year:   . Arboriculturist in the Last Year:   Transportation Needs:   . Film/video editor (Medical):   Marland Kitchen Lack of Transportation (Non-Medical):   Physical Activity:   . Days of Exercise per Week:   . Minutes of Exercise per Session:   Stress:   . Feeling of Stress :   Social Connections:   . Frequency of Communication with Friends and Family:   . Frequency of Social Gatherings with Friends and Family:   . Attends Religious Services:   . Active Member of Clubs or Organizations:   . Attends Archivist Meetings:   Marland Kitchen Marital Status:   Intimate Partner Violence:   . Fear of Current or Ex-Partner:   . Emotionally Abused:   Marland Kitchen Physically Abused:   . Sexually Abused:      ROS- All systems are reviewed and negative except as per the HPI above.  Physical Exam: Vitals:   06/08/19 1136  BP: 140/68  Pulse: 87  Weight: 130.4 kg  Height: 6' (1.829 m)    GEN- The patient is well appearing obese male, alert and oriented x 3 today.   HEENT-head normocephalic, atraumatic, sclera clear, conjunctiva pink, hearing intact, trachea midline. Lungs- Clear to ausculation bilaterally, normal work of breathing Heart- irregular rate and rhythm, no murmurs, rubs or gallops  GI- soft, NT, ND, + BS Extremities- no clubbing, cyanosis, or edema MS- no significant deformity or atrophy Skin- no rash or lesion Psych- euthymic mood, full affect Neuro- strength and sensation are intact   Wt Readings from Last 3 Encounters:   06/08/19 130.4 kg  05/18/19 130.4 kg  05/11/19 129.5 kg    EKG today demonstrates afib HR 87, QRS 82, QTc 430  Echo 05/09/19 demonstrated  1. Poor acoustic windows limit study.  2. Left ventricular ejection fraction, by estimation, is 55 to 60%. The  left ventricle has normal function. The left ventricle has no regional  wall motion abnormalities. There is moderate left ventricular hypertrophy.  Left ventricular diastolic parameters are indeterminate.  3. Right ventricular systolic function is normal. The right ventricular  size is normal.  4. The mitral valve is abnormal. No evidence of mitral valve  regurgitation.  5. The aortic valve is normal in structure. Aortic valve regurgitation is  not visualized.  6. The inferior vena cava is  dilated in size with <50% respiratory  variability, suggesting right atrial pressure of 15 mmHg.   LA volume 115  Epic records are reviewed at length today  CHA2DS2-VASc Score = 3 The patient's score is based upon: CHF History: Yes HTN History: Yes Age : < 65 Diabetes History: Yes Stroke History: No Vascular Disease History: No Gender: Male   ASSESSMENT AND PLAN: 1. Persistent Atrial Fibrillation (ICD10:  I48.0) The patient's CHA2DS2-VASc score is 3, indicating a 3.2% annual risk of stroke.   Patient wants to pursue dofetilide, aware of risk vrs benefit Aware of price of dofetilide Patient will continue on Xarelto 20 mg daily, states no missed doses No benadryl use PharmD has screened drugs and no QT prolonging drugs on board QTc in SR 422 ms, Labs today show creatinine at 1.29, K+ 4.3 and mag 2.1, CrCl calculated at 111 mL/min Lifestyle modification was discussed as below and including smoking cessation.  2. Secondary Hypercoagulable State (ICD10:  D68.69) The patient is at significant risk for stroke/thromboembolism based upon his CHA2DS2-VASc Score of 3.  Continue Rivaroxaban (Xarelto).   3. Obesity Body mass index is 38.98  kg/m. Lifestyle modification was discussed and encouraged including regular physical activity and weight reduction.  4. Witnessed apnea/daytime somnolence Sleep study has been ordered.   5. Diastolic CHF His weight is stable, no symptoms of fluid overload today.  6. HTN Stable, no changes today.   To be admitted later today when a bed becomes available.   [facility] [address] [place], [place] [address] [phone] 06/08/2019 12:23 PM

## 2019-06-08 NOTE — TOC Benefit Eligibility Note (Signed)
Transition of Care [facility]) Benefit Eligibility Note    Patient Details  Name: [name] Sanford MRN: [id number] Date of Birth: 1955-04-17   Medication/Dose: DOFETILIDE  500 MCG BID  Covered?: Yes  Tier: (TIER- 4 DRUG)  Prescription Coverage [facility]  Spoke with Person/Company/Phone Number:: ANGELI   @   [employer] # [phone]  Co-Pay: $3.70  Prior Approval: No  Deductible: (NO DEDUCTIBLE WITH PLAN /  HAS LOWE INCOME  SUBIDY)  Additional Notes: TIKOSYN   500 MCG  : Crecencio Mc Phone Number: 06/08/2019, 4:41 PM

## 2019-06-08 NOTE — Progress Notes (Signed)
Pharmacy: Dofetilide (Tikosyn) - Initial Consult Assessment and Electrolyte Replacement  Pharmacy consulted to assist in monitoring and replacing electrolytes in this 64 y.o. male admitted on 06/08/2019 undergoing dofetilide initiation. First dofetilide dose: 06/08/19  Assessment:  Patient Exclusion Criteria: If any screening criteria checked as "Yes", then  patient  should NOT receive dofetilide until criteria item is corrected.  If "Yes" please indicate correction plan.  YES  NO Patient  Exclusion Criteria Correction Plan   _0   _1   Baseline QTc interval is greater than or equal to 440 msec. IF above YES box checked dofetilide contraindicated unless patient has ICD; then may proceed if QTc 500-550 msec or with known ventricular conduction abnormalities may proceed with QTc 550-600 msec. QTc =      _2   _3   Patient is known or suspected to have a digoxin level greater than 2 ng/ml: No results found for: DIGOXIN     _4   _5   Creatinine clearance less than 20 ml/min (calculated using Cockcroft-Gault, actual body weight and serum creatinine): Estimated Creatinine Clearance: 84 mL/min (A) (by C-G formula based on SCr of 1.29 mg/dL (H)).     _6   _7  Patient has received drugs known to prolong the QT intervals within the last 48 hours (phenothiazines, tricyclics or tetracyclic antidepressants, erythromycin, H-1 antihistamines, cisapride, fluoroquinolones, azithromycin). Updated information on QT prolonging agents is available to be searched on the following database:QT prolonging agents     _8   _9   Patient received a dose of hydrochlorothiazide (Oretic) alone or in any combination including triamterene (Dyazide, Maxzide) in the last 48 hours.    _10   _11  Patient received a medication known to increase dofetilide plasma concentrations prior to initial dofetilide dose:  . Trimethoprim (Primsol, Proloprim) in the last 36 hours . Verapamil (Calan, Verelan) in the last 36 hours or a  sustained release dose in the last 72 hours . Megestrol (Megace) in the last 5 days  . Cimetidine (Tagamet) in the last 6 hours . Ketoconazole (Nizoral) in the last 24 hours . Itraconazole (Sporanox) in the last 48 hours  . Prochlorperazine (Compazine) in the last 36 hours     _12   _13   Patient is known to have a history of torsades de pointes; congenital or acquired long QT syndromes.    _14   _15   Patient has received a Class 1 antiarrhythmic with less than 2 half-lives since last dose. (Disopyramide, Quinidine, Procainamide, Lidocaine, Mexiletine, Flecainide, Propafenone)    _16   _17   Patient has received amiodarone therapy in the past 3 months or amiodarone level is greater than 0.3 ng/ml.    Patient has been appropriately anticoagulated with Xarelto 97m qHS.  Labs:    Component Value Date/Time   K 4.3 06/08/2019 1216   MG 2.1 06/08/2019 1216     Plan: Potassium: K >/= 4: Appropriate to initiate Tikosyn, no replacement needed    Magnesium: Mg >2: Appropriate to initiate Tikosyn, no replacement needed     Thank you for allowing pharmacy to participate in this patient's care    MArrie Senate PharmD, BCPS Clinical Pharmacist [phone] check AMION for all MBiscaynumbers 06/08/2019

## 2019-06-08 NOTE — H&P (Signed)
Primary Care Physician: Patient, No Pcp Per Primary Cardiologist: Dr Agustin Cree Primary Electrophysiologist: none Referring Physician: Dr Ronnald Ramp   [name] Sanford is a 64 y.o. male with a history of COPD, HTN, DM, tobacco abuse, diastolic CHF, and paroxysmal atrial fibrillation who presents for follow up in the [facility].  The patient was initially diagnosed with atrial fibrillation on 05/09/19 after presenting to the ED with symptoms of gradually worsening symptoms of SOB. He was found to be acutely fluid overloaded EF normal 55-60% and was admitted. Patient was started on Xarelto for a CHADS2VASC score of 3. Patient converted spontaneously to SR the night of 3/21. He reports that he has felt well until 05/17/19 when he had symptoms of fatigue. He denies significant alcohol use. He does admit to witnessed apnea and daytime somnolence. He denies any bleeding issues on anticoagulation.   On follow up today, he presents for dofetilide admission. Patient reports doing well since his last visit. He denies any missed doses of anticoagulation.   Today, he denies symptoms of palpitations, chest pain, orthopnea, PND, dizziness, presyncope, syncope, bleeding, or neurologic sequela. The patient is tolerating medications without difficulties and is otherwise without complaint today.    Atrial Fibrillation Risk Factors:  he does have symptoms or diagnosis of sleep apnea. he does not have a history of rheumatic fever. he does not have a history of alcohol use. The patient does not have a history of early familial atrial fibrillation or other arrhythmias.  he has a BMI of Body mass index is 40.11 kg/m.Marland Kitchen Filed Weights   06/08/19 1442  Weight: 130.5 kg    History reviewed. No pertinent family history.   Atrial Fibrillation Management history:  Previous antiarrhythmic drugs: none Previous cardioversions: none Previous ablations: none CHADS2VASC score: 3 Anticoagulation  history: Xarelto    Past Medical History:  Diagnosis Date  . Arthritis   . Atrial fibrillation ([place])   . Back pain   . COPD (chronic obstructive pulmonary disease) ([place])   . Visit for monitoring Tikosyn therapy 06/08/2019   Past Surgical History:  Procedure Laterality Date  . BRAIN SURGERY    . NECK SURGERY    . SHOULDER SURGERY    . SPINAL CORD STIMULATOR IMPLANT      Current Facility-Administered Medications  Medication Dose Route Frequency Provider Last Rate Last Admin  . 0.9 %  sodium chloride infusion  250 mL Intravenous PRN Fenton, Clint R, PA      . albuterol (PROVENTIL) (2.5 MG/3ML) 0.083% nebulizer solution 3 mL  3 mL Inhalation Q4H PRN Shirley Friar, PA-C      . dofetilide (TIKOSYN) capsule 500 mcg  500 mcg Oral BID Fenton, Clint R, PA   500 mcg at 06/08/19 2000  . [START ON 06/09/2019] fluticasone furoate-vilanterol (BREO ELLIPTA) 200-25 MCG/INH 1 puff  1 puff Inhalation Daily Shirley Friar, PA-C      . [START ON 06/09/2019] furosemide (LASIX) tablet 40 mg  40 mg Oral Daily Shirley Friar, PA-C      . [START ON 06/09/2019] HYDROmorphone HCl (EXALGO) SR tablet 16 mg  16 mg Oral Daily Shirley Friar, PA-C      . ipratropium (ATROVENT) nebulizer solution 0.5 mg  0.5 mg Nebulization QID Shirley Friar, PA-C      . Derrill Memo ON 06/09/2019] linaclotide (LINZESS) capsule 290 mcg  290 mcg Oral q morning - 10a Shirley Friar, PA-C      . nicotine (NICODERM CQ -  dosed in mg/24 hours) patch 21 mg  21 mg Transdermal Q24H Shirley Friar, PA-C      . oxyCODONE (Oxy IR/ROXICODONE) immediate release tablet 30 mg  30 mg Oral Q4H PRN Shirley Friar, PA-C   30 mg at 06/08/19 2000  . [START ON 06/09/2019] pantoprazole (PROTONIX) EC tablet 40 mg  40 mg Oral Daily Shirley Friar, PA-C      . rivaroxaban (XARELTO) tablet 20 mg  20 mg Oral Q supper Fenton, Clint R, PA   20 mg at 06/08/19 1812  . sodium chloride flush (NS)  0.9 % injection 10-40 mL  10-40 mL Intracatheter Q12H Fidelia Cathers, Jeneen Rinks, MD   10 mL at 06/08/19 2001  . sodium chloride flush (NS) 0.9 % injection 10-40 mL  10-40 mL Intracatheter PRN El Pile, MD      . sodium chloride flush (NS) 0.9 % injection 3 mL  3 mL Intravenous Q12H Fenton, Clint R, PA      . sodium chloride flush (NS) 0.9 % injection 3 mL  3 mL Intravenous PRN Fenton, Clint R, PA        Allergies  Allergen Reactions  . Oxymorphone Other (See Comments)    Flu like symptoms, oral blisters; Opana    Social History   Socioeconomic History  . Marital status: Married    Spouse name: Not on file  . Number of children: Not on file  . Years of education: Not on file  . Highest education level: Not on file  Occupational History  . Not on file  Tobacco Use  . Smoking status: Current Every Day Smoker    Packs/day: 1.00    Years: 45.00    Pack years: 45.00    Types: Cigarettes  . Smokeless tobacco: Never Used  Substance and Sexual Activity  . Alcohol use: Not Currently  . Drug use: Never  . Sexual activity: Not on file  Other Topics Concern  . Not on file  Social History Narrative  . Not on file   Social Determinants of Health   Financial Resource Strain:   . Difficulty of Paying Living Expenses:   Food Insecurity:   . Worried About Charity fundraiser in the Last Year:   . Arboriculturist in the Last Year:   Transportation Needs:   . Film/video editor (Medical):   Marland Kitchen Lack of Transportation (Non-Medical):   Physical Activity:   . Days of Exercise per Week:   . Minutes of Exercise per Session:   Stress:   . Feeling of Stress :   Social Connections:   . Frequency of Communication with Friends and Family:   . Frequency of Social Gatherings with Friends and Family:   . Attends Religious Services:   . Active Member of Clubs or Organizations:   . Attends Archivist Meetings:   Marland Kitchen Marital Status:   Intimate Partner Violence:   . Fear of Current or  Ex-Partner:   . Emotionally Abused:   Marland Kitchen Physically Abused:   . Sexually Abused:      ROS- All systems are reviewed and negative except as per the HPI above.  Physical Exam: Vitals:   06/08/19 1442 06/08/19 2009  BP: (!) 147/89 (!) 150/86  Pulse: 70 88  Resp:  20  Temp: 98.1 F (36.7 C) 98.7 F (37.1 C)  TempSrc: Oral Oral  SpO2: 95% 94%  Weight: 130.5 kg   Height: 5' 11" (1.803 m)  GEN- The patient is well appearing obese male, alert and oriented x 3 today.   HEENT-head normocephalic, atraumatic, sclera clear, conjunctiva pink, hearing intact, trachea midline. Lungs- Clear to ausculation bilaterally, normal work of breathing Heart- irregular rate and rhythm, no murmurs, rubs or gallops  GI- soft, NT, ND, + BS Extremities- no clubbing, cyanosis, or edema MS- no significant deformity or atrophy Skin- no rash or lesion Psych- euthymic mood, full affect Neuro- strength and sensation are intact   Wt Readings from Last 3 Encounters:  06/08/19 130.5 kg  06/08/19 130.4 kg  05/18/19 130.4 kg    EKG today demonstrates afib HR 87, QRS 82, QTc 430  Echo 05/09/19 demonstrated  1. Poor acoustic windows limit study.  2. Left ventricular ejection fraction, by estimation, is 55 to 60%. The  left ventricle has normal function. The left ventricle has no regional  wall motion abnormalities. There is moderate left ventricular hypertrophy.  Left ventricular diastolic parameters are indeterminate.  3. Right ventricular systolic function is normal. The right ventricular  size is normal.  4. The mitral valve is abnormal. No evidence of mitral valve  regurgitation.  5. The aortic valve is normal in structure. Aortic valve regurgitation is  not visualized.  6. The inferior vena cava is dilated in size with <50% respiratory  variability, suggesting right atrial pressure of 15 mmHg.   LA volume 115  Epic records are reviewed at length today  CHA2DS2-VASc Score = 3 The  patient's score is based upon: CHF History: Yes HTN History: Yes Age : < 65 Diabetes History: Yes Stroke History: No Vascular Disease History: No Gender: Male   ASSESSMENT AND PLAN: 1. Persistent Atrial Fibrillation (ICD10:  I48.0) The patient's CHA2DS2-VASc score is 3, indicating a 3.2% annual risk of stroke.   Patient wants to pursue dofetilide, aware of risk vrs benefit Aware of price of dofetilide Patient will continue on Xarelto 20 mg daily, states no missed doses No benadryl use PharmD has screened drugs and no QT prolonging drugs on board QTc in SR 422 ms, Labs today show creatinine at 1.29, K+ 4.3 and mag 2.1, CrCl calculated at 111 mL/min Lifestyle modification was discussed as below and including smoking cessation.  2. Secondary Hypercoagulable State (ICD10:  D68.69) The patient is at significant risk for stroke/thromboembolism based upon his CHA2DS2-VASc Score of 3.  Continue Rivaroxaban (Xarelto).   3. Obesity Body mass index is 40.11 kg/m. Lifestyle modification was discussed and encouraged including regular physical activity and weight reduction.  4. Witnessed apnea/daytime somnolence Sleep study has been ordered.   5. Diastolic CHF His weight is stable, no symptoms of fluid overload today.  6. HTN Stable, no changes today.   To be admitted later today when a bed becomes available.   [facility] [address] [place], [place] [address] [phone] 06/08/2019 8:54 PM     I have seen, examined the patient, and reviewed the above assessment and plan.  Changes to above are made where necessary.  On exam, iRRR.  The patient has symptomatic persistent afib.  He reports compliance with xarelto without interruption. We will admit for initiation of tikosyn at this time.  Risks and benefits to this medicine were discussed at length with patient and family  Today.  Co Sign: Thompson Grayer, MD 06/08/2019 8:54 PM

## 2019-06-09 LAB — BASIC METABOLIC PANEL
Anion gap: 6 (ref 5–15)
BUN: 10 mg/dL (ref 8–23)
CO2: 35 mmol/L — ABNORMAL HIGH (ref 22–32)
Calcium: 8.9 mg/dL (ref 8.9–10.3)
Chloride: 98 mmol/L (ref 98–111)
Creatinine, Ser: 1.25 mg/dL — ABNORMAL HIGH (ref 0.61–1.24)
GFR calc Af Amer: >60 mL/min (ref >60)
GFR calc non Af Amer: >60 mL/min (ref >60)
Glucose, Bld: 107 mg/dL — ABNORMAL HIGH (ref 70–99)
Potassium: 3.8 mmol/L (ref 3.5–5.1)
Sodium: 139 mmol/L (ref 135–145)

## 2019-06-09 LAB — MAGNESIUM: Magnesium: 2.2 mg/dL (ref 1.7–2.4)

## 2019-06-09 MED ORDER — NICOTINE 21 MG/24HR TD PT24
21.0000 mg | MEDICATED_PATCH | TRANSDERMAL | Status: DC
  Administered 2019-06-10: 21 mg via TRANSDERMAL
  Filled 2019-06-09: qty 1

## 2019-06-09 MED ORDER — POTASSIUM CHLORIDE CRYS ER 20 MEQ PO TBCR
40.0000 meq | EXTENDED_RELEASE_TABLET | Freq: Once | ORAL | Status: AC
  Administered 2019-06-09: 40 meq via ORAL
  Filled 2019-06-09: qty 2

## 2019-06-09 MED ORDER — PANTOPRAZOLE SODIUM 20 MG PO TBEC
40.0000 mg | DELAYED_RELEASE_TABLET | Freq: Every day | ORAL | Status: DC
  Administered 2019-06-09 – 2019-06-11 (×3): 40 mg via ORAL
  Filled 2019-06-09 (×3): qty 2

## 2019-06-09 MED ORDER — POTASSIUM CHLORIDE CRYS ER 20 MEQ PO TBCR: 20.0000 meq | EXTENDED_RELEASE_TABLET | Freq: Once | ORAL | Status: DC

## 2019-06-09 MED ORDER — FUROSEMIDE 40 MG PO TABS
40.0000 mg | ORAL_TABLET | Freq: Every day | ORAL | Status: DC
  Administered 2019-06-09 – 2019-06-11 (×2): 40 mg via ORAL
  Filled 2019-06-09 (×3): qty 1

## 2019-06-09 NOTE — Progress Notes (Signed)
Co-pay given to patient 100.00 for Tikosyn. Patient concerned about  Cost. Wishes to use Good [facility]. Will need to give patient perscription or make sure that pharmacy will take good RX if called in. Checked with patients pharmacy [facility] in [place], does not have in stock any of the mg. [facility] [address] [facility] [place] none in stock, and [facility], who has the best price in good [facility] does not have any in stock.  All of these pharmacies can order medication, it takes 1-2 days. Will  Inform patient Recommendation . It may be the best option to have initial fill through [facility], then move script to pharmacy of choice for refills, where the patient can then use good rx

## 2019-06-09 NOTE — Progress Notes (Addendum)
Electrophysiology Rounding Note  Patient Name: [name] Sanford Date of Encounter: 06/09/2019  Primary Cardiologist: No primary care provider on file.  Electrophysiologist: None    Subjective   Pt converted to sinus rhythm on Tikosyn 500 mcg BID   QTc from EKG last pm shows stable QTc at ~470 ms in AF. Pt converted to NSR just after 2330.   The patient is doing well today.  At this time, the patient denies chest pain, shortness of breath, or any new concerns.  Inpatient Medications    Scheduled Meds: . dofetilide  500 mcg Oral BID  . fluticasone furoate-vilanterol  1 puff Inhalation Daily  . furosemide  40 mg Oral Daily  . HYDROmorphone HCl  16 mg Oral Daily  . ipratropium  0.5 mg Nebulization QID  . linaclotide  290 mcg Oral q morning - 10a  . nicotine  21 mg Transdermal Q24H  . pantoprazole  40 mg Oral Daily  . potassium chloride  20 mEq Oral Once  . rivaroxaban  20 mg Oral Q supper  . sodium chloride flush  10-40 mL Intracatheter Q12H  . sodium chloride flush  3 mL Intravenous Q12H   Continuous Infusions: . sodium chloride     PRN Meds: sodium chloride, albuterol, oxycodone, sodium chloride flush, sodium chloride flush   Vital Signs    Vitals:   06/09/19 0004 06/09/19 0445 06/09/19 0449 06/09/19 0749  BP: 135/68  (!) 154/98   Pulse: (!) 58  71 81  Resp: 17  (!) 21 16  Temp: 98.3 F (36.8 C)  97.9 F (36.6 C)   TempSrc: Oral  Oral   SpO2: 91%  90% 91%  Weight:  130 kg    Height:        Intake/Output Summary (Last 24 hours) at 06/09/2019 0759 Last data filed at 06/09/2019 0008 Gross per 24 hour  Intake 684 ml  Output --  Net 684 ml   Filed Weights   06/08/19 1442 06/09/19 0445  Weight: 130.5 kg 130 kg    Physical Exam    GEN- The patient is well appearing, alert and oriented x 3 today.   Head- normocephalic, atraumatic Eyes-  Sclera clear, conjunctiva pink Ears- hearing intact Oropharynx- clear Neck- supple Lungs- Clear to ausculation  bilaterally, normal work of breathing Heart- Regular rate and rhythm, no murmurs, rubs or gallops GI- soft, NT, ND, + BS Extremities- +2 edema Skin- no rash or lesion Psych- euthymic mood, full affect Neuro- strength and sensation are intact  Labs    CBC No results for input(s): WBC, NEUTROABS, HGB, HCT, MCV, PLT in the last 72 hours. Basic Metabolic Panel Recent Labs    06/08/19 1216 06/09/19 0500  NA 139 139  K 4.3 3.8  CL 93* 98  CO2 36* 35*  GLUCOSE 182* 107*  BUN 9 10  CREATININE 1.29* 1.25*  CALCIUM 9.2 8.9  MG 2.1 2.2    Potassium  Date/Time Value Ref Range Status  06/09/2019 05:00 AM 3.8 3.5 - 5.1 mmol/L Final   Magnesium  Date/Time Value Ref Range Status  06/09/2019 05:00 AM 2.2 1.7 - 2.4 mg/dL Final    Comment:    Performed at [facility], [place] [address], [place], [place] [address]    Telemetry    Converted to NSR around 2330 last night (personally reviewed)  Radiology    No results found.   Patient Profile     [name] Sanford is a 64 y.o. male with a  past medical history significant for persistent atrial fibrillation.  They were admitted for tikosyn load.   Assessment & Plan    1. Persistent atrial fibrillation Pt converted to sinus rhythm on Tikosyn 500 mcg BID  Continue Xarelto Mg stable. K 3.8. Supp ordered. CHA2DS2VASC is at least 1.   2. Suspected sleep apnea Outpatient sleep study pending Weight loss encouraged.   Pt should not require DCCV unless he goes back into AF and sustains today.   For questions or updates, please contact [place] Please consult [email or web address] for contact info under Cardiology/STEMI.  Signed, Shirley Friar, PA-C  06/09/2019, 7:59 AM   Remains in sinus.  He has acute on chronic diastolic dysfunction with NYHA Class III CHF.  He will likely require IV diuresis.  Thompson Grayer MD, [place]

## 2019-06-09 NOTE — TOC Benefit Eligibility Note (Signed)
Transition of Care [facility]) Benefit Eligibility Note    Patient Details  Name: [name] Sanford MRN: [id number] Date of Birth: [birth year]   Medication/Dose: DOFETILIDE  125 MG BID - CO-PAY- $100.00 ,  DOFETILIDE 250 MG BID -CO-PAY-$100.00,   DOFETILIDE 500 MG BID  -CO-PAY-$100.00  Covered?: Yes  Tier: (TIER- 4  DRUG)  Prescription Coverage Preferred Pharmacy: [facility] with Person/Company/Phone Number:: NIA   @  [facility] # [phone]  Co-Pay: $100.00  Prior Approval: No  Deductible: (NO DEDUCTIBLE  and   NO OUT-OF-POCKET:)  Additional Notes: TIKOSYN   125 MG BID,   250 MG BID ,   500 MG BID  : NON-FORMULARY , P/A YES  # [phone]    Memory Argue Phone Number: 06/09/2019, 12:20 PM

## 2019-06-09 NOTE — Progress Notes (Signed)
Morning EKG reviewed  Shows has converted to NSR/sinus brady at 54 bpm with stable QTc ~430 ms.  Continue Tikosyn 500 mcg BID.   Pt will not require DCCV   Annamaria Helling  Pager: [phone]  06/09/2019 12:32 PM

## 2019-06-09 NOTE — Progress Notes (Signed)
Benefits checks for eligibility done on Tikosyn/Diofetilide for 500/250/125 mg BID  Will follow up and inform the patient of co- pay

## 2019-06-09 NOTE — Progress Notes (Signed)
Pharmacy: Dofetilide (Tikosyn) - Follow Up Assessment and Electrolyte Replacement  Pharmacy consulted to assist in monitoring and replacing electrolytes in this 64 y.o. male admitted on 06/08/2019 undergoing dofetilide initiation. First dofetilide dose: 06/08/19  Labs:    Component Value Date/Time   K 3.8 06/09/2019 0500   MG 2.2 06/09/2019 0500     Plan: Potassium: K 3.8-3.9:  Give KCl 40 mEq po x1   Magnesium: Mg > 2: No additional supplementation needed   Thank you for allowing pharmacy to participate in this patient's care   Erin Hearing PharmD., BCPS Clinical Pharmacist 06/09/2019 11:55 AM

## 2019-06-10 LAB — BASIC METABOLIC PANEL
Anion gap: 7 (ref 5–15)
BUN: 11 mg/dL (ref 8–23)
CO2: 34 mmol/L — ABNORMAL HIGH (ref 22–32)
Calcium: 9.1 mg/dL (ref 8.9–10.3)
Chloride: 97 mmol/L — ABNORMAL LOW (ref 98–111)
Creatinine, Ser: 1.15 mg/dL (ref 0.61–1.24)
GFR calc Af Amer: >60 mL/min (ref >60)
GFR calc non Af Amer: >60 mL/min (ref >60)
Glucose, Bld: 82 mg/dL (ref 70–99)
Potassium: 4.1 mmol/L (ref 3.5–5.1)
Sodium: 138 mmol/L (ref 135–145)

## 2019-06-10 LAB — MAGNESIUM: Magnesium: 2.2 mg/dL (ref 1.7–2.4)

## 2019-06-10 MED ORDER — FUROSEMIDE 10 MG/ML IJ SOLN
80.0000 mg | Freq: Once | INTRAMUSCULAR | Status: AC
  Administered 2019-06-10: 80 mg via INTRAVENOUS
  Filled 2019-06-10: qty 8

## 2019-06-10 MED ORDER — POTASSIUM CHLORIDE CRYS ER 20 MEQ PO TBCR
40.0000 meq | EXTENDED_RELEASE_TABLET | Freq: Once | ORAL | Status: AC
  Administered 2019-06-10: 40 meq via ORAL
  Filled 2019-06-10: qty 2

## 2019-06-10 NOTE — Progress Notes (Signed)
Pharmacy: Dofetilide (Tikosyn) - Follow Up Assessment and Electrolyte Replacement  Pharmacy consulted to assist in monitoring and replacing electrolytes in this 64 y.o. male admitted on 06/08/2019 undergoing dofetilide initiation. First dofetilide dose: 06/08/19  Labs:    Component Value Date/Time   K 4.1 06/10/2019 0534   MG 2.2 06/10/2019 0534     Plan: Potassium: K >/= 4: No additional supplementation needed  Magnesium: Mg > 2: No additional supplementation needed  Patient has required a total of 75mq x1 of kcl this admit. Given trend in patient's potassium, do not expect patient to need any potassium or magnesium at discharge.   Thank you for allowing pharmacy to participate in this patient's care   FErin HearingPharmD., BCPS Clinical Pharmacist 06/10/2019 7:36 AM

## 2019-06-10 NOTE — Progress Notes (Signed)
Morning EKG reviewed Stable QTc on Tikosyn Anticipate discharge home tomorrow  Chanetta Marshall, NP 06/10/2019 11:10 AM

## 2019-06-10 NOTE — Care Management (Signed)
1512 06-10-19 Tikosyn Load! Patient is aware of cost. We discussed pharmacies and patient wants his first fill at the [facility] 30 day supply. Patient will use paper scripts thereafter, due to he wants to shop around with [facility]. Pharmacy has been changed in [place] to [facility]. No further needs from Case Manager at this time. Bethena Roys, RN,BSN Case Manager

## 2019-06-10 NOTE — Progress Notes (Addendum)
Electrophysiology Rounding Note  Patient Name: [name] Sanford Date of Encounter: 06/10/2019  Electrophysiologist: Kyriaki Moder   Subjective   The patient is doing well today.  At this time, the patient denies chest pain, shortness of breath, or any new concerns.  Inpatient Medications    Scheduled Meds: . dofetilide  500 mcg Oral BID  . fluticasone furoate-vilanterol  1 puff Inhalation Daily  . furosemide  40 mg Oral Daily  . HYDROmorphone HCl  16 mg Oral Daily  . ipratropium  0.5 mg Nebulization QID  . linaclotide  290 mcg Oral q morning - 10a  . nicotine  21 mg Transdermal Q24H  . pantoprazole  40 mg Oral Daily  . rivaroxaban  20 mg Oral Q supper  . sodium chloride flush  10-40 mL Intracatheter Q12H  . sodium chloride flush  3 mL Intravenous Q12H   Continuous Infusions: . sodium chloride     PRN Meds: sodium chloride, albuterol, oxycodone, sodium chloride flush, sodium chloride flush   Vital Signs    Vitals:   06/09/19 2100 06/10/19 0353 06/10/19 0353 06/10/19 0726  BP: (!) 164/91  (!) 146/79   Pulse: 63  (!) 59 68  Resp: _0 Temp: 98.3 F (36.8 C)  (!) 97.5 F (36.4 C)   TempSrc: Oral  Oral   SpO2: 92%  91% 92%  Weight:  130.5 kg    Height:        Intake/Output Summary (Last 24 hours) at 06/10/2019 0747 Last data filed at 06/09/2019 2000 Gross per 24 hour  Intake 850 ml  Output --  Net 850 ml   Filed Weights   06/08/19 1442 06/09/19 0445 06/10/19 0353  Weight: 130.5 kg 130 kg 130.5 kg    Physical Exam    GEN- The patient is well appearing, alert and oriented x 3 today.   Head- normocephalic, atraumatic Eyes-  Sclera clear, conjunctiva pink Ears- hearing intact Oropharynx- clear Neck- supple Lungs- Clear to ausculation bilaterally, normal work of breathing Heart- Regular rate and rhythm  GI- soft, NT, ND, + BS Extremities- no clubbing, cyanosis, or edema Skin- no rash or lesion Psych- euthymic mood, full affect Neuro- strength and sensation  are intact  Labs    Basic Metabolic Panel Recent Labs    06/09/19 0500 06/10/19 0534  NA 139 138  K 3.8 4.1  CL 98 97*  CO2 35* 34*  GLUCOSE 107* 82  BUN 10 11  CREATININE 1.25* 1.15  CALCIUM 8.9 9.1  MG 2.2 2.2     Telemetry    SR with PAC's (personally reviewed)  Radiology    No results found.   Patient Profile     [name] Sanford is a 64 y.o. male admitted for Tikosyn load   Assessment & Plan    1.  Persistent atrial fibrillation Admitted for Tikosyn load Converted to SR on Tikosyn Continue Xarelto for CHADS2VASC of at least 1 Labs and QTc stable  2.  Suspected OSA Sleep study pending Weight loss encouraged   3.  Decompensated diastolic heart failure Will give 1 dose of IV lasix today  Will have research come and talk with patient about Alleviate HF trial  For questions or updates, please contact [place] Please consult [email or web address] for contact info under Cardiology/STEMI.  Signed, Chanetta Marshall, NP  06/10/2019, 7:47 AM    I have seen, examined the patient, and reviewed the above assessment and plan.  Changes to above are made where necessary.  On exam, RRR.  3+ edema today.  We discussed his CHf at length today.  He is quite limited chronically, now worse.  He has SOB at baseline and is very uncomfortable with edema currently.  He requests IV lasix, which I think is appropriate. We will aggressively diuresis while here.  Co Sign: Thompson Grayer, MD 06/10/2019 8:09 AM

## 2019-06-11 ENCOUNTER — Telehealth: Payer: Self-pay | Admitting: Physician Assistant

## 2019-06-11 LAB — BASIC METABOLIC PANEL
Anion gap: 9 (ref 5–15)
BUN: 12 mg/dL (ref 8–23)
CO2: 32 mmol/L (ref 22–32)
Calcium: 8.9 mg/dL (ref 8.9–10.3)
Chloride: 98 mmol/L (ref 98–111)
Creatinine, Ser: 1.1 mg/dL (ref 0.61–1.24)
GFR calc Af Amer: >60 mL/min (ref >60)
GFR calc non Af Amer: >60 mL/min (ref >60)
Glucose, Bld: 107 mg/dL — ABNORMAL HIGH (ref 70–99)
Potassium: 4 mmol/L (ref 3.5–5.1)
Sodium: 139 mmol/L (ref 135–145)

## 2019-06-11 LAB — MAGNESIUM: Magnesium: 2.2 mg/dL (ref 1.7–2.4)

## 2019-06-11 MED ORDER — RIVAROXABAN 20 MG PO TABS: 20.0000 mg | ORAL_TABLET | Freq: Every day | ORAL | 3 refills | Status: DC

## 2019-06-11 MED ORDER — FUROSEMIDE 40 MG PO TABS: 40.0000 mg | ORAL_TABLET | Freq: Every day | ORAL | 3 refills | Status: DC

## 2019-06-11 MED ORDER — DOFETILIDE 500 MCG PO CAPS: 500.0000 ug | ORAL_CAPSULE | Freq: Two times a day (BID) | ORAL | 0 refills | Status: DC

## 2019-06-11 MED FILL — DOFETILIDE 500 MCG CAPS: 500 | 30 days supply | Qty: 60 | Fill #0

## 2019-06-11 NOTE — Progress Notes (Signed)
Doing well with tikosyn load Remains in sinus  Clinically improved with diuresis  He spoke with our research team and wishes to think about Alleviate HF trial further.  He may consider as an outpatient.  Will anticipate discharge later this am with close follow-up in the AF clinic.  May also benefit from seeing Dr Curt Bears in [place] office as well  Thompson Grayer MD, [facility] 06/11/2019 8:04 AM

## 2019-06-11 NOTE — Discharge Instructions (Signed)
You have an appointment set up with the [facility].  Multiple studies have shown that being followed by a dedicated atrial fibrillation clinic in addition to the standard care you receive from your other physicians improves health. We believe that enrollment in the atrial fibrillation clinic will allow [place] to better care for you.   The phone number to the [facility] is [phone]. The clinic is staffed Monday through Friday from 8:30am to 5pm.  Parking Directions: The clinic is located in the Heart and Vascular Building connected to [facility]. 1)From [address] turn on to [address] and go to the 3rd entrance  (Heart and Vascular entrance) on the right. 2)Look to the right for Heart &Vascular Parking Garage. 3)A code for the entrance is required please call the clinic to receive this.   4)Take the elevators to the 1st floor. Registration is in the room with the glass walls at the end of the hallway.  If you have any trouble parking or locating the clinic, please don't hesitate to call [phone].

## 2019-06-11 NOTE — Telephone Encounter (Signed)
Paged for patient need refill of Xarelto and lasix. Sent to requested pharmacy.

## 2019-06-11 NOTE — Progress Notes (Signed)
Pharmacy: Dofetilide (Tikosyn) - Follow Up Assessment and Electrolyte Replacement  Pharmacy consulted to assist in monitoring and replacing electrolytes in this 64 y.o. male admitted on 06/08/2019 undergoing dofetilide initiation. First dofetilide dose: 06/08/19  Labs:    Component Value Date/Time   K 4.0 06/11/2019 0600   MG 2.2 06/11/2019 0600     Plan: Potassium: K >/= 4: No additional supplementation needed  Magnesium: Mg > 2: No additional supplementation needed  Patient has required a total of 42mq x1 of kcl this admit. Given trend in patient's potassium, do not recommend any potassium or magnesium at discharge.   Thank you for allowing pharmacy to participate in this patient's care   FErin HearingPharmD., BCPS Clinical Pharmacist 06/11/2019 11:23 AM

## 2019-06-11 NOTE — Discharge Summary (Signed)
ELECTROPHYSIOLOGY PROCEDURE DISCHARGE SUMMARY    Patient ID: [name] Sanford,  MRN: [id number], DOB/AGE: 1955-09-29 64 y.o.  Admit date: 06/08/2019 Discharge date: 06/11/2019  Primary Care Physician: Patient, No Pcp Per Electrophysiologist: [place]  Primary Discharge Diagnosis:  1.  Persistent atrial fibrillation status post Tikosyn loading this admission  Secondary Discharge Diagnosis:  1.  COPD 2.  HTN 3.  Diabetes 4.  Tobacco abuse 5.  Diastolic heart failure  Allergies  Allergen Reactions  . Oxymorphone Other (See Comments)    Flu like symptoms, oral blisters; Opana     Procedures This Admission:  1.  Tikosyn loading  Brief HPI: [name] Sanford is a 64 y.o. male with a past medical history as noted above.  They were referred to EP in the outpatient setting for treatment options of atrial fibrillation.  Risks, benefits, and alternatives to Tikosyn were reviewed with the patient who wished to proceed.    Hospital Course:  The patient was admitted and Tikosyn was initiated.  Renal function and electrolytes were followed during the hospitalization.  Their QTc remained stable.   They were monitored until discharge on telemetry which demonstrated SR. He was volume overloaded this admission and received 1 dose of IV lasix for acute diastolic heart failure.  He was screened by research for Alleviate HF study and wanted to think about this more as an outpatient.  On the day of discharge, they were examined by Dr Rayann Heman who considered them stable for discharge to home.  Follow-up has been arranged with AF clinic in 1 week.  Physical Exam: Vitals:   06/10/19 1527 06/10/19 2147 06/11/19 0613 06/11/19 0831  BP: (!) 149/77 (!) 154/75 (!) 149/79   Pulse: (!) 57 (!) 58 62 74  Resp: _0 Temp: 98.1 F (36.7 C) 98.3 F (36.8 C) 98 F (36.7 C)   TempSrc: Oral Oral Oral   SpO2: 97% 92% 93% 91%  Weight:   128.4 kg   Height:        GEN- The patient is well appearing, alert  and oriented x 3 today.   HEENT: normocephalic, atraumatic; sclera clear, conjunctiva pink; hearing intact; oropharynx clear; neck supple  Lungs- Clear to ausculation bilaterally, normal work of breathing.  No wheezes, rales, rhonchi Heart- Regular rate and rhythm  GI- soft, non-tender, non-distended, bowel sounds present  Extremities- no clubbing, cyanosis, or edema  MS- no significant deformity or atrophy Skin- warm and dry, no rash or lesion Psych- euthymic mood, full affect Neuro- strength and sensation are intact   Labs:   Lab Results  Component Value Date   WBC 12.6 (H) 05/10/2019   HGB 14.3 05/10/2019   HCT 45.3 05/10/2019   MCV 92.3 05/10/2019   PLT 264 05/10/2019    Recent Labs  Lab 06/11/19 0600  NA 139  K 4.0  CL 98  CO2 32  BUN 12  CREATININE 1.10  CALCIUM 8.9  GLUCOSE 107*     Discharge Medications:  Allergies as of 06/11/2019      Reactions   Oxymorphone Other (See Comments)   Flu like symptoms, oral blisters; Opana      Medication List    TAKE these medications   albuterol (2.5 MG/3ML) 0.083% nebulizer solution Commonly known as: PROVENTIL Inhale 3 mLs into the lungs every 4 (four) hours as needed for shortness of breath.   Atrovent HFA 17 MCG/ACT inhaler Generic drug: ipratropium Inhale 2 puffs into the lungs in the morning, at  noon, in the evening, and at bedtime.   dofetilide 500 MCG capsule Commonly known as: TIKOSYN Take 1 capsule (500 mcg total) by mouth 2 (two) times daily.   esomeprazole 20 MG capsule Commonly known as: NEXIUM Take 20 mg by mouth daily at 12 noon.   furosemide 40 MG tablet Commonly known as: LASIX Take 1 tablet (40 mg total) by mouth daily.   HYDROmorphone HCl ER 16 MG Tb24 Take 16 mg by mouth daily.   Linzess 290 MCG Caps capsule Generic drug: linaclotide Take 290 mcg by mouth every morning.   mometasone 0.1 % cream Commonly known as: ELOCON Apply 1 application topically as needed (rosacea).     nicotine 21 mg/24hr patch Commonly known as: NICODERM CQ - dosed in mg/24 hours Place 1 patch (21 mg total) onto the skin daily.   oxycodone 30 MG immediate release tablet Commonly known as: ROXICODONE Take 30 mg by mouth every 4 (four) hours as needed for pain.   rivaroxaban 20 MG Tabs tablet Commonly known as: Xarelto Take 1 tablet (20 mg total) by mouth daily with supper.   testosterone cypionate 200 MG/ML injection Commonly known as: DEPOTESTOSTERONE CYPIONATE Inject 200 mg into the muscle every 14 (fourteen) days.   Wixela Inhub 250-50 MCG/DOSE Aepb Generic drug: Fluticasone-Salmeterol Inhale 1 puff into the lungs 2 (two) times daily.       Disposition:   Follow-up Information    [facility] Follow up on 06/18/2019.   Specialty: Cardiology Why: at 10:30AM  Contact information: [address] [id number] [place] [phone]          Duration of Discharge Encounter: Greater than 30 minutes including physician time.  Signed, Chanetta Marshall, NP 06/11/2019 11:07 AM

## 2019-06-11 NOTE — Care Management Important Message (Signed)
Important Message  Patient Details  Name: [name] MRN: [id number] Date of Birth: [birth year]   Medicare Important Message Given:  Yes     Shelda Altes 06/11/2019, 8:44 AM

## 2019-06-17 ENCOUNTER — Telehealth: Payer: Self-pay | Admitting: *Deleted

## 2019-06-17 NOTE — Progress Notes (Signed)
Primary Care Physician: Patient, No Pcp Per Primary Cardiologist: Dr Agustin Cree Primary Electrophysiologist: Dr Curt Bears (new) Referring Physician: Dr Ronnald Ramp   [name] Sanford is a 64 y.o. male with a history of COPD, HTN, DM, tobacco abuse, diastolic CHF, and paroxysmal atrial fibrillation who presents for follow up in the [facility].  The patient was initially diagnosed with atrial fibrillation on 05/09/19 after presenting to the ED with symptoms of gradually worsening symptoms of SOB. He was found to be acutely fluid overloaded EF normal 55-60% and was admitted. Patient was started on Xarelto for a CHADS2VASC score of 3. Patient converted spontaneously to SR the night of 3/21. He reports that he has felt well until 05/17/19 when he had symptoms of fatigue. He denies significant alcohol use. He does admit to witnessed apnea and daytime somnolence. He denies any bleeding issues on anticoagulation.   On follow up today, patient is s/p dofetilide loading 4/20-4/23/21. He converted to SR with the medication and did not require DCCV. Patient reports he has done well since his hospitalization. He is tolerating the mediation without difficulty. He was discharged on Lasix BID for his diastolic CHF which has helped his edema. He declines participation in the Alleviate HF trial.   Today, he denies symptoms of palpitations, chest pain, orthopnea, PND, dizziness, presyncope, syncope, bleeding, or neurologic sequela. The patient is tolerating medications without difficulties and is otherwise without complaint today.    Atrial Fibrillation Risk Factors:  he does have symptoms or diagnosis of sleep apnea. he does not have a history of rheumatic fever. he does not have a history of alcohol use. The patient does not have a history of early familial atrial fibrillation or other arrhythmias.  he has a BMI of Body mass index is 39.94 kg/m.Marland Kitchen Filed Weights   06/18/19 1031  Weight: 129.9  kg    No family history on file.   Atrial Fibrillation Management history:  Previous antiarrhythmic drugs: dofetilide Previous cardioversions: none Previous ablations: none CHADS2VASC score: 3 Anticoagulation history: Xarelto    Past Medical History:  Diagnosis Date  . Arthritis   . Atrial fibrillation ([place])   . Back pain   . COPD (chronic obstructive pulmonary disease) ([place])   . Visit for monitoring Tikosyn therapy 06/08/2019   Past Surgical History:  Procedure Laterality Date  . BRAIN SURGERY    . NECK SURGERY    . SHOULDER SURGERY    . SPINAL CORD STIMULATOR IMPLANT      Current Outpatient Medications  Medication Sig Dispense Refill  . albuterol (PROVENTIL) (2.5 MG/3ML) 0.083% nebulizer solution Inhale 3 mLs into the lungs every 4 (four) hours as needed for shortness of breath.    . ATROVENT HFA 17 MCG/ACT inhaler Inhale 2 puffs into the lungs in the morning, at noon, in the evening, and at bedtime.     . dofetilide (TIKOSYN) 500 MCG capsule Take 1 capsule (500 mcg total) by mouth 2 (two) times daily. 60 capsule 0  . esomeprazole (NEXIUM) 20 MG capsule Take 20 mg by mouth daily at 12 noon.    . furosemide (LASIX) 40 MG tablet Take 1 tablet (40 mg total) by mouth daily. 30 tablet 3  . HYDROmorphone HCl ER 16 MG TB24 Take 16 mg by mouth daily.     Marland Kitchen LINZESS 290 MCG CAPS capsule Take 290 mcg by mouth every morning.    . mometasone (ELOCON) 0.1 % cream Apply 1 application topically as needed (rosacea).     Marland Kitchen  nicotine (NICODERM CQ - DOSED IN MG/24 HOURS) 21 mg/24hr patch Place 1 patch (21 mg total) onto the skin daily. 42 patch 1  . oxycodone (ROXICODONE) 30 MG immediate release tablet Take 30 mg by mouth every 4 (four) hours as needed for pain.    . rivaroxaban (XARELTO) 20 MG TABS tablet Take 1 tablet (20 mg total) by mouth daily with supper. 30 tablet 3  . testosterone cypionate (DEPOTESTOSTERONE CYPIONATE) 200 MG/ML injection Inject 200 mg into the muscle every 14  (fourteen) days.    Grant Ruts INHUB 250-50 MCG/DOSE AEPB Inhale 1 puff into the lungs 2 (two) times daily.     No current facility-administered medications for this encounter.    Allergies  Allergen Reactions  . Oxymorphone Other (See Comments)    Flu like symptoms, oral blisters; Opana    Social History   Socioeconomic History  . Marital status: Married    Spouse name: Not on file  . Number of children: Not on file  . Years of education: Not on file  . Highest education level: Not on file  Occupational History  . Not on file  Tobacco Use  . Smoking status: Current Every Day Smoker    Packs/day: 1.00    Years: 45.00    Pack years: 45.00    Types: Cigarettes  . Smokeless tobacco: Never Used  . Tobacco comment: 6-7 cigarettes daily  Substance and Sexual Activity  . Alcohol use: Not Currently  . Drug use: Never  . Sexual activity: Not on file  Other Topics Concern  . Not on file  Social History Narrative  . Not on file   Social Determinants of Health   Financial Resource Strain:   . Difficulty of Paying Living Expenses:   Food Insecurity:   . Worried About Charity fundraiser in the Last Year:   . Arboriculturist in the Last Year:   Transportation Needs:   . Film/video editor (Medical):   Marland Kitchen Lack of Transportation (Non-Medical):   Physical Activity:   . Days of Exercise per Week:   . Minutes of Exercise per Session:   Stress:   . Feeling of Stress :   Social Connections:   . Frequency of Communication with Friends and Family:   . Frequency of Social Gatherings with Friends and Family:   . Attends Religious Services:   . Active Member of Clubs or Organizations:   . Attends Archivist Meetings:   Marland Kitchen Marital Status:   Intimate Partner Violence:   . Fear of Current or Ex-Partner:   . Emotionally Abused:   Marland Kitchen Physically Abused:   . Sexually Abused:      ROS- All systems are reviewed and negative except as per the HPI above.  Physical  Exam: Vitals:   06/18/19 1031  BP: 118/78  Pulse: 73  Weight: 129.9 kg  Height: _0  (1.803 m)    GEN- The patient is well appearing obese male, alert and oriented x 3 today.   HEENT-head normocephalic, atraumatic, sclera clear, conjunctiva pink, hearing intact, trachea midline. Lungs- Clear to ausculation bilaterally, normal work of breathing Heart- Regular rate and rhythm, no murmurs, rubs or gallops  GI- soft, NT, ND, + BS Extremities- no clubbing, cyanosis. Trace bilateral edema MS- no significant deformity or atrophy Skin- no rash or lesion Psych- euthymic mood, full affect Neuro- strength and sensation are intact   Wt Readings from Last 3 Encounters:  06/18/19 129.9 kg  06/11/19  128.4 kg  06/08/19 130.4 kg    EKG today demonstrates SR HR 73, PR 188, QRS 90, QTc 453  Echo 05/09/19 demonstrated  1. Poor acoustic windows limit study.  2. Left ventricular ejection fraction, by estimation, is 55 to 60%. The  left ventricle has normal function. The left ventricle has no regional  wall motion abnormalities. There is moderate left ventricular hypertrophy.  Left ventricular diastolic parameters are indeterminate.  3. Right ventricular systolic function is normal. The right ventricular  size is normal.  4. The mitral valve is abnormal. No evidence of mitral valve  regurgitation.  5. The aortic valve is normal in structure. Aortic valve regurgitation is  not visualized.  6. The inferior vena cava is dilated in size with <50% respiratory  variability, suggesting right atrial pressure of 15 mmHg.   LA volume 115  Epic records are reviewed at length today  CHA2DS2-VASc Score = 3 The patient's score is based upon: CHF History: Yes HTN History: Yes Age : < 65 Diabetes History: Yes Stroke History: No Vascular Disease History: No Gender: Male   ASSESSMENT AND PLAN: 1. Persistent Atrial Fibrillation (ICD10:  I48.0) The patient's CHA2DS2-VASc score is 3,  indicating a 3.2% annual risk of stroke.   S/p dofetilide loading 4/20-4/23/21. Patient appears to be maintaining SR. Continue dofetilide 500 mcg BID. QT stable.  Continue Xarelto 20 mg daily Check bmet/mag today.  2. Secondary Hypercoagulable State (ICD10:  D68.69) The patient is at significant risk for stroke/thromboembolism based upon his CHA2DS2-VASc Score of 3.  Continue Rivaroxaban (Xarelto).   3. Obesity Body mass index is 39.94 kg/m. Lifestyle modification was discussed and encouraged including regular physical activity and weight reduction. Activity limited by arthritis.   4. Witnessed apnea/daytime somnolence Sleep study ordered and pending.  5. Diastolic CHF Diuresed during his hospitalization. Weight stable, improved lower extremity edema.  bmet as above. Continue Lasix 40 mg BID.  6. HTN Stable, no changes today.   Patient lives in [place] and agreeable to seeing Dr Curt Bears there. Follow up with Dr Curt Bears in [place] in one month. AF clinic in 4 months.    [facility] [address] [place], [place] [address] [phone] 06/18/2019 11:01 AM

## 2019-06-17 NOTE — Telephone Encounter (Signed)
Staff messages sent to Gae Bon ok to schedule sleep study. Per [employer] no PA is required. Decision [id number].

## 2019-06-18 ENCOUNTER — Ambulatory Visit (HOSPITAL_COMMUNITY)
Admit: 2019-06-18 | Discharge: 2019-06-18 | Disposition: A | Discharge status: Home / Self Care | Payer: Medicare Other | Attending: Physician Assistant | Admitting: Physician Assistant

## 2019-06-18 ENCOUNTER — Encounter (HOSPITAL_COMMUNITY): Payer: Self-pay | Admitting: Physician Assistant

## 2019-06-18 ENCOUNTER — Other Ambulatory Visit: Payer: Self-pay

## 2019-06-18 VITALS — BP 118/78 | HR 73 | Ht 71.0 in | Wt 286.4 lb

## 2019-06-18 DIAGNOSIS — F17210 Nicotine dependence, cigarettes, uncomplicated: Secondary | ICD-10-CM | POA: Insufficient documentation

## 2019-06-18 DIAGNOSIS — I480 Paroxysmal atrial fibrillation: Secondary | ICD-10-CM | POA: Insufficient documentation

## 2019-06-18 DIAGNOSIS — D6869 Other thrombophilia: Secondary | ICD-10-CM | POA: Insufficient documentation

## 2019-06-18 DIAGNOSIS — E119 Type 2 diabetes mellitus without complications: Secondary | ICD-10-CM | POA: Insufficient documentation

## 2019-06-18 DIAGNOSIS — I4819 Other persistent atrial fibrillation: Secondary | ICD-10-CM | POA: Diagnosis not present

## 2019-06-18 DIAGNOSIS — E669 Obesity, unspecified: Secondary | ICD-10-CM | POA: Insufficient documentation

## 2019-06-18 DIAGNOSIS — Z7901 Long term (current) use of anticoagulants: Secondary | ICD-10-CM | POA: Insufficient documentation

## 2019-06-18 DIAGNOSIS — I110 Hypertensive heart disease with heart failure: Secondary | ICD-10-CM | POA: Insufficient documentation

## 2019-06-18 DIAGNOSIS — Z79899 Other long term (current) drug therapy: Secondary | ICD-10-CM | POA: Diagnosis not present

## 2019-06-18 DIAGNOSIS — Z6839 Body mass index (BMI) 39.0-39.9, adult: Secondary | ICD-10-CM | POA: Insufficient documentation

## 2019-06-18 DIAGNOSIS — R0681 Apnea, not elsewhere classified: Secondary | ICD-10-CM | POA: Diagnosis not present

## 2019-06-18 DIAGNOSIS — I5030 Unspecified diastolic (congestive) heart failure: Secondary | ICD-10-CM | POA: Diagnosis not present

## 2019-06-18 DIAGNOSIS — J449 Chronic obstructive pulmonary disease, unspecified: Secondary | ICD-10-CM | POA: Diagnosis not present

## 2019-06-18 LAB — BASIC METABOLIC PANEL
Anion gap: 11 (ref 5–15)
BUN: 10 mg/dL (ref 8–23)
CO2: 29 mmol/L (ref 22–32)
Calcium: 9.1 mg/dL (ref 8.9–10.3)
Chloride: 98 mmol/L (ref 98–111)
Creatinine, Ser: 1.28 mg/dL — ABNORMAL HIGH (ref 0.61–1.24)
GFR calc Af Amer: >60 mL/min (ref >60)
GFR calc non Af Amer: >60 mL/min (ref >60)
Glucose, Bld: 135 mg/dL — ABNORMAL HIGH (ref 70–99)
Potassium: 4.7 mmol/L (ref 3.5–5.1)
Sodium: 138 mmol/L (ref 135–145)

## 2019-06-18 LAB — MAGNESIUM: Magnesium: 2.2 mg/dL (ref 1.7–2.4)

## 2019-06-18 MED ORDER — FUROSEMIDE 40 MG PO TABS: 40.0000 mg | ORAL_TABLET | Freq: Two times a day (BID) | ORAL | 6 refills | Status: DC

## 2019-06-18 NOTE — Addendum Note (Signed)
Encounter addended by: Juluis Mire, RN on: 06/18/2019 11:45 AM  Actions taken: Pharmacy for encounter modified, Order list changed

## 2019-06-21 ENCOUNTER — Telehealth: Payer: Self-pay | Admitting: *Deleted

## 2019-06-21 NOTE — Telephone Encounter (Addendum)
  Lauralee Evener, CMA  Freada Bergeron, CMA  Ok to schedule sleep study. Per [employer] no PA is required. Decision ID: [id number].        Patient is scheduled for lab study on 07/04/19. pt is scheduled for COVID screening on 07/02/19 1:15 pm prior to SS.Marland Kitchen  Patient understands her sleep study will be done at [facility] sleep lab. Patient understands she will receive a sleep packet in a week or so. Patient understands to call if she does not receive the sleep packet in a timely manner. Patient agrees with treatment and thanked me for call.

## 2019-07-01 ENCOUNTER — Other Ambulatory Visit (HOSPITAL_COMMUNITY)
Admission: RE | Admit: 2019-07-01 | Discharge: 2019-07-01 | Disposition: A | Discharge status: Home / Self Care | Payer: Medicare Other | Source: Ambulatory Visit | Attending: Cardiology | Admitting: Cardiology

## 2019-07-01 DIAGNOSIS — Z20822 Contact with and (suspected) exposure to covid-19: Secondary | ICD-10-CM | POA: Diagnosis not present

## 2019-07-01 DIAGNOSIS — Z01812 Encounter for preprocedural laboratory examination: Secondary | ICD-10-CM | POA: Diagnosis present

## 2019-07-01 LAB — SARS CORONAVIRUS 2 (TAT 6-24 HRS): SARS Coronavirus 2: NEGATIVE

## 2019-07-02 ENCOUNTER — Other Ambulatory Visit (HOSPITAL_COMMUNITY): Payer: Medicare Other

## 2019-07-04 ENCOUNTER — Ambulatory Visit (HOSPITAL_BASED_OUTPATIENT_CLINIC_OR_DEPARTMENT_OTHER): Payer: Medicare Other | Attending: Physician Assistant | Admitting: Cardiology

## 2019-07-04 ENCOUNTER — Other Ambulatory Visit: Payer: Self-pay

## 2019-07-04 DIAGNOSIS — R0902 Hypoxemia: Secondary | ICD-10-CM | POA: Diagnosis not present

## 2019-07-04 DIAGNOSIS — R0681 Apnea, not elsewhere classified: Secondary | ICD-10-CM

## 2019-07-04 DIAGNOSIS — R0683 Snoring: Secondary | ICD-10-CM | POA: Diagnosis not present

## 2019-07-04 DIAGNOSIS — J449 Chronic obstructive pulmonary disease, unspecified: Secondary | ICD-10-CM | POA: Diagnosis not present

## 2019-07-04 DIAGNOSIS — G4736 Sleep related hypoventilation in conditions classified elsewhere: Secondary | ICD-10-CM | POA: Insufficient documentation

## 2019-07-06 NOTE — Procedures (Signed)
Patient Name: [name] Sanford, Dorer Date: 07/04/2019 Gender: Male D.O.B: 02-06-1956 Age (years): 9 Referring Provider: Malka So PA Height (inches): 71 Interpreting Physician: Fransico Him MD, ABSM Weight (lbs): 277 RPSGT: Gwenyth Allegra BMI: 39 MRN: [id number] Neck Size: 18.00  CLINICAL INFORMATION Sleep Study Type: NPSG  Indication for sleep study: N/A  Epworth Sleepiness Score: 14  SLEEP STUDY TECHNIQUE As per the AASM Manual for the Scoring of Sleep and Associated Events v2.3 (April 2016) with a hypopnea requiring 4% desaturations.  The channels recorded and monitored were frontal, central and occipital EEG, electrooculogram (EOG), submentalis EMG (chin), nasal and oral airflow, thoracic and abdominal wall motion, anterior tibialis EMG, snore microphone, electrocardiogram, and pulse oximetry.  MEDICATIONS Medications self-administered by patient taken the night of the study : OXYCODONE HCL, Atroyent HFA  SLEEP ARCHITECTURE The study was initiated at 9:44:28 PM and ended at 4:07:03 AM.  Sleep onset time was 10.3 minutes and the sleep efficiency was 75.1%. The total sleep time was 287.5 minutes.  Stage REM latency was 53.0 minutes.  The patient spent 5.2% of the night in stage N1 sleep, 78.1% in stage N2 sleep, 0.0% in stage N3 and 16.7% in REM.  Alpha intrusion was absent.  Supine sleep was 0.00%.  RESPIRATORY PARAMETERS The overall apnea/hypopnea index (AHI) was 0.0 per hour. There were 0 total apneas, including 0 obstructive, 0 central and 0 mixed apneas. There were 0 hypopneas and 0 RERAs.  The AHI during Stage REM sleep was 0.0 per hour.  AHI while supine was N/A per hour.  The mean oxygen saturation was 90.8%. The minimum SpO2 during sleep was 81.0%.  moderate snoring was noted during this study.  CARDIAC DATA The 2 lead EKG demonstrated sinus rhythm. The mean heart rate was 70.2 beats per minute. Other EKG findings include: PACs.  LEG MOVEMENT  DATA The total PLMS were 0 with a resulting PLMS index of 0.0. Associated arousal with leg movement index was 0.0 .  IMPRESSIONS - No significant obstructive sleep apnea occurred during this study (AHI = 0.0/h). - No significant central sleep apnea occurred during this study (CAI = 0.0/h). - Mild oxygen desaturation was noted during this study (Min O2 = 81.0%). - The patient snored with moderate snoring volume. - PACs were noted during this study. - Clinically significant periodic limb movements did not occur during sleep. No significant associated arousals.  DIAGNOSIS - Nocturnal Hypoxemia (327.26 [G47.36 ICD-10])  RECOMMENDATIONS - Patient has nocturnal and wakeful hypoxemia with no evidence of sleep disordered breathing.  Patient has a diagnosis of COPD, therefore recommend referral to pulmonary for assessment of COPD and both daytime and nocturnal hypoxemia. - Avoid alcohol, sedatives and other CNS depressants that may worsen sleep apnea and disrupt normal sleep architecture. - Sleep hygiene should be reviewed to assess factors that may improve sleep quality. - Weight management and regular exercise should be initiated or continued if appropriate.  [Electronically signed] 07/06/2019 01:13 PM  Fransico Him MD, ABSM Diplomate, American Board of Sleep Medicine

## 2019-07-07 ENCOUNTER — Other Ambulatory Visit (HOSPITAL_COMMUNITY): Payer: Self-pay | Admitting: *Deleted

## 2019-07-07 MED ORDER — DOFETILIDE 500 MCG PO CAPS: 500.0000 ug | ORAL_CAPSULE | Freq: Two times a day (BID) | ORAL | 3 refills | Status: DC

## 2019-07-08 ENCOUNTER — Telehealth: Payer: Self-pay | Admitting: *Deleted

## 2019-07-08 NOTE — Telephone Encounter (Signed)
Informed patient of sleep study results and patient understanding was verbalized. Patient understands HIS sleep study showed No evidence of OSA but has both daytime and nocturnal hypoxemia. Please refer to Pulmonary for evaluation of COPD. Pt is aware and agreeable to normal results. Patient wants to know which pulmonary doctor and he would like one local in [place] [employer]. Will send to sleep nurse to make the referral.

## 2019-07-08 NOTE — Telephone Encounter (Signed)
-----  Message from Sueanne Margarita, MD sent at 07/06/2019  1:16 PM EDT ----- No evidence of OSA but has both daytime and nocturnal hypoxemia.  Please refer to Pulmonary for evaluation of COPD

## 2019-07-12 ENCOUNTER — Telehealth: Payer: Self-pay

## 2019-07-12 DIAGNOSIS — J449 Chronic obstructive pulmonary disease, unspecified: Secondary | ICD-10-CM

## 2019-07-12 NOTE — Telephone Encounter (Signed)
-----  Message -----  From: Sueanne Margarita, MD  Sent: 07/06/2019  1:16 PM EDT  To: Freada Bergeron, CMA   No evidence of OSA but has both daytime and nocturnal hypoxemia. Please refer to Pulmonary for evaluation of COPD    Spoke with the patient and advised him that I have placed a referral for him to see [facility] in [place].

## 2019-07-13 ENCOUNTER — Ambulatory Visit (HOSPITAL_COMMUNITY)
Admission: RE | Admit: 2019-07-13 | Discharge: 2019-07-13 | Disposition: A | Discharge status: Home / Self Care | Payer: Medicare Other | Source: Ambulatory Visit | Attending: Physician Assistant | Admitting: Physician Assistant

## 2019-07-13 ENCOUNTER — Other Ambulatory Visit: Payer: Self-pay

## 2019-07-13 VITALS — BP 130/62 | HR 59 | Ht 71.0 in | Wt 284.4 lb

## 2019-07-13 DIAGNOSIS — E669 Obesity, unspecified: Secondary | ICD-10-CM | POA: Insufficient documentation

## 2019-07-13 DIAGNOSIS — Z79890 Hormone replacement therapy (postmenopausal): Secondary | ICD-10-CM | POA: Insufficient documentation

## 2019-07-13 DIAGNOSIS — J449 Chronic obstructive pulmonary disease, unspecified: Secondary | ICD-10-CM | POA: Diagnosis not present

## 2019-07-13 DIAGNOSIS — I4819 Other persistent atrial fibrillation: Secondary | ICD-10-CM | POA: Diagnosis not present

## 2019-07-13 DIAGNOSIS — I5030 Unspecified diastolic (congestive) heart failure: Secondary | ICD-10-CM | POA: Insufficient documentation

## 2019-07-13 DIAGNOSIS — Z7901 Long term (current) use of anticoagulants: Secondary | ICD-10-CM | POA: Diagnosis not present

## 2019-07-13 DIAGNOSIS — Z79899 Other long term (current) drug therapy: Secondary | ICD-10-CM | POA: Diagnosis not present

## 2019-07-13 DIAGNOSIS — I480 Paroxysmal atrial fibrillation: Secondary | ICD-10-CM | POA: Insufficient documentation

## 2019-07-13 DIAGNOSIS — F17210 Nicotine dependence, cigarettes, uncomplicated: Secondary | ICD-10-CM | POA: Insufficient documentation

## 2019-07-13 DIAGNOSIS — Z6839 Body mass index (BMI) 39.0-39.9, adult: Secondary | ICD-10-CM | POA: Diagnosis not present

## 2019-07-13 DIAGNOSIS — I110 Hypertensive heart disease with heart failure: Secondary | ICD-10-CM | POA: Diagnosis not present

## 2019-07-13 DIAGNOSIS — D6869 Other thrombophilia: Secondary | ICD-10-CM | POA: Insufficient documentation

## 2019-07-13 DIAGNOSIS — E119 Type 2 diabetes mellitus without complications: Secondary | ICD-10-CM | POA: Diagnosis not present

## 2019-07-13 LAB — BASIC METABOLIC PANEL
Anion gap: 12 (ref 5–15)
BUN: 15 mg/dL (ref 8–23)
CO2: 31 mmol/L (ref 22–32)
Calcium: 9.5 mg/dL (ref 8.9–10.3)
Chloride: 96 mmol/L — ABNORMAL LOW (ref 98–111)
Creatinine, Ser: 1.22 mg/dL (ref 0.61–1.24)
GFR calc Af Amer: >60 mL/min (ref >60)
GFR calc non Af Amer: >60 mL/min (ref >60)
Glucose, Bld: 98 mg/dL (ref 70–99)
Potassium: 4.4 mmol/L (ref 3.5–5.1)
Sodium: 139 mmol/L (ref 135–145)

## 2019-07-13 MED ORDER — FUROSEMIDE 40 MG PO TABS: ORAL_TABLET | ORAL | 6 refills | Status: DC

## 2019-07-13 NOTE — Patient Instructions (Signed)
Increase lasix 73m in the morning and 467min the evening.   You have been [place] to cardiology [place]

## 2019-07-13 NOTE — Progress Notes (Signed)
Primary Care Physician: Patient, No Pcp Per Primary Cardiologist: Dr Agustin Cree (remotely) Primary Electrophysiologist: Dr Curt Bears (new) Referring Physician: Dr Ronnald Ramp   [name] Sanford is a 64 y.o. male with a history of COPD, HTN, DM, tobacco abuse, diastolic CHF, and paroxysmal atrial fibrillation who presents for follow up in the [facility].  The patient was initially diagnosed with atrial fibrillation on 05/09/19 after presenting to the ED with symptoms of gradually worsening symptoms of SOB. He was found to be acutely fluid overloaded EF normal 55-60% and was admitted. Patient was started on Xarelto for a CHADS2VASC score of 3. Patient converted spontaneously to SR the night of 3/21. He reports that he has felt well until 05/17/19 when he had symptoms of fatigue. He denies significant alcohol use. He does admit to witnessed apnea and daytime somnolence. He denies any bleeding issues on anticoagulation. Patient is s/p dofetilide loading 4/20-4/23/21. He converted to SR with the medication and did not require DCCV. He was discharged on Lasix BID for his diastolic CHF.  On follow up today, patient reports he has had increased leg swelling since his last visit. He denies orthopnea or PND. His SOB is at baseline. He reports that he has been strict with his low sodium diet. He is in SR today.   Today, he denies symptoms of palpitations, chest pain, orthopnea, PND, dizziness, presyncope, syncope, bleeding, or neurologic sequela. The patient is tolerating medications without difficulties and is otherwise without complaint today.    Atrial Fibrillation Risk Factors:  he does have symptoms or diagnosis of sleep apnea. he does not have a history of rheumatic fever. he does not have a history of alcohol use. The patient does not have a history of early familial atrial fibrillation or other arrhythmias.  he has a BMI of Body mass index is 39.67 kg/m.Marland Kitchen Filed Weights    07/13/19 0908  Weight: 129 kg    No family history on file.   Atrial Fibrillation Management history:  Previous antiarrhythmic drugs: dofetilide Previous cardioversions: none Previous ablations: none CHADS2VASC score: 3 Anticoagulation history: Xarelto    Past Medical History:  Diagnosis Date  . Arthritis   . Atrial fibrillation ([place])   . Back pain   . COPD (chronic obstructive pulmonary disease) ([place])   . Visit for monitoring Tikosyn therapy 06/08/2019   Past Surgical History:  Procedure Laterality Date  . BRAIN SURGERY    . NECK SURGERY    . SHOULDER SURGERY    . SPINAL CORD STIMULATOR IMPLANT      Current Outpatient Medications  Medication Sig Dispense Refill  . albuterol (PROVENTIL) (2.5 MG/3ML) 0.083% nebulizer solution Inhale 3 mLs into the lungs every 4 (four) hours as needed for shortness of breath.    . ATROVENT HFA 17 MCG/ACT inhaler Inhale 2 puffs into the lungs in the morning, at noon, in the evening, and at bedtime.     . dofetilide (TIKOSYN) 500 MCG capsule Take 1 capsule (500 mcg total) by mouth 2 (two) times daily. 60 capsule 3  . esomeprazole (NEXIUM) 20 MG capsule Take 20 mg by mouth daily at 12 noon.    . furosemide (LASIX) 40 MG tablet Take 26m in the AM and 485min the PM 75 tablet 6  . HYDROmorphone HCl ER 16 MG TB24 Take 16 mg by mouth daily.     . Marland KitchenINZESS 290 MCG CAPS capsule Take 290 mcg by mouth every morning.    . mometasone (ELOCON) 0.1 %  cream Apply 1 application topically as needed (rosacea).     . nicotine (NICODERM CQ - DOSED IN MG/24 HOURS) 21 mg/24hr patch Place 1 patch (21 mg total) onto the skin daily. 42 patch 1  . oxycodone (ROXICODONE) 30 MG immediate release tablet Take 30 mg by mouth every 4 (four) hours as needed for pain.    . rivaroxaban (XARELTO) 20 MG TABS tablet Take 1 tablet (20 mg total) by mouth daily with supper. 30 tablet 3  . testosterone cypionate (DEPOTESTOSTERONE CYPIONATE) 200 MG/ML injection Inject 200 mg into the  muscle every 14 (fourteen) days.    Grant Ruts INHUB 250-50 MCG/DOSE AEPB Inhale 1 puff into the lungs 2 (two) times daily.     No current facility-administered medications for this encounter.    Allergies  Allergen Reactions  . Oxymorphone Other (See Comments)    Flu like symptoms, oral blisters; Opana    Social History   Socioeconomic History  . Marital status: Married    Spouse name: Not on file  . Number of children: Not on file  . Years of education: Not on file  . Highest education level: Not on file  Occupational History  . Not on file  Tobacco Use  . Smoking status: Current Every Day Smoker    Packs/day: 1.00    Years: 45.00    Pack years: 45.00    Types: Cigarettes  . Smokeless tobacco: Never Used  . Tobacco comment: 6-7 cigarettes daily  Substance and Sexual Activity  . Alcohol use: Not Currently  . Drug use: Never  . Sexual activity: Not on file  Other Topics Concern  . Not on file  Social History Narrative  . Not on file   Social Determinants of Health   Financial Resource Strain:   . Difficulty of Paying Living Expenses:   Food Insecurity:   . Worried About Charity fundraiser in the Last Year:   . Arboriculturist in the Last Year:   Transportation Needs:   . Film/video editor (Medical):   Marland Kitchen Lack of Transportation (Non-Medical):   Physical Activity:   . Days of Exercise per Week:   . Minutes of Exercise per Session:   Stress:   . Feeling of Stress :   Social Connections:   . Frequency of Communication with Friends and Family:   . Frequency of Social Gatherings with Friends and Family:   . Attends Religious Services:   . Active Member of Clubs or Organizations:   . Attends Archivist Meetings:   Marland Kitchen Marital Status:   Intimate Partner Violence:   . Fear of Current or Ex-Partner:   . Emotionally Abused:   Marland Kitchen Physically Abused:   . Sexually Abused:      ROS- All systems are reviewed and negative except as per the HPI  above.  Physical Exam: Vitals:   07/13/19 0908  BP: 130/62  Pulse: (!) 59  Weight: 129 kg  Height: _0  (1.803 m)    GEN- The patient is well appearing obese male, alert and oriented x 3 today.   HEENT-head normocephalic, atraumatic, sclera clear, conjunctiva pink, hearing intact, trachea midline. Lungs- Clear to ausculation bilaterally, normal work of breathing Heart- Regular rate and rhythm, no murmurs, rubs or gallops  GI- soft, NT, ND, + BS Extremities- no clubbing, cyanosis. 1+ bilateral edema MS- no significant deformity or atrophy Skin- no rash or lesion Psych- euthymic mood, full affect Neuro- strength and sensation are intact  Wt Readings from Last 3 Encounters:  07/13/19 129 kg  07/04/19 125.6 kg  06/18/19 129.9 kg    EKG today demonstrates SB HR 59, PR 190, QRS 86, QTc 431  Echo 05/09/19 demonstrated  1. Poor acoustic windows limit study.  2. Left ventricular ejection fraction, by estimation, is 55 to 60%. The  left ventricle has normal function. The left ventricle has no regional  wall motion abnormalities. There is moderate left ventricular hypertrophy.  Left ventricular diastolic parameters are indeterminate.  3. Right ventricular systolic function is normal. The right ventricular  size is normal.  4. The mitral valve is abnormal. No evidence of mitral valve  regurgitation.  5. The aortic valve is normal in structure. Aortic valve regurgitation is  not visualized.  6. The inferior vena cava is dilated in size with <50% respiratory  variability, suggesting right atrial pressure of 15 mmHg.   LA volume 115  Epic records are reviewed at length today  CHA2DS2-VASc Score = 3 The patient's score is based upon: CHF History: Yes HTN History: Yes Age : < 65 Diabetes History: Yes Stroke History: No Vascular Disease History: No Gender: Male   ASSESSMENT AND PLAN: 1. Persistent Atrial Fibrillation (ICD10:  I48.0) The patient's CHA2DS2-VASc score  is 3, indicating a 3.2% annual risk of stroke.   S/p dofetilide loading 4/20-4/23/21. Patient appears to be maintaining SR. Continue dofetilide 500 mcg BID. QT stable.  Continue Xarelto 20 mg daily  2. Secondary Hypercoagulable State (ICD10:  D68.69) The patient is at significant risk for stroke/thromboembolism based upon his CHA2DS2-VASc Score of 3.  Continue Rivaroxaban (Xarelto).   3. Obesity Body mass index is 39.67 kg/m. Lifestyle modification was discussed and encouraged including regular physical activity and weight reduction.  4. Witnessed apnea/daytime somnolence Sleep study negative for OSA but there was nocturnal hypoxemia noted. Referral for pulmonary evaluation per Dr Radford Pax.  5. Diastolic CHF Patient's weight is up and he reports increased lower extremity edema. He denies orthopnea or PND Increase Lasix to 60 mg AM and 40 mg PM 2 g sodium diet Compression stockings Bmet today Will refer to cardiology in [place]  6. HTN Stable, no changes today.   Follow up to establish care with cardiology in [place]. Dr Curt Bears as scheduled.    [facility] [address] [place],  [address] [phone] 07/13/2019 9:40 AM

## 2019-07-14 ENCOUNTER — Ambulatory Visit (HOSPITAL_COMMUNITY): Payer: Medicare Other | Admitting: Physician Assistant

## 2019-07-27 ENCOUNTER — Ambulatory Visit: Payer: Medicare Other | Admitting: Cardiology

## 2019-07-27 ENCOUNTER — Encounter: Payer: Self-pay | Admitting: Emergency Medicine

## 2019-08-03 ENCOUNTER — Ambulatory Visit: Payer: Medicare Other | Admitting: Cardiology

## 2019-08-16 ENCOUNTER — Other Ambulatory Visit: Payer: Self-pay

## 2019-08-16 ENCOUNTER — Encounter: Payer: Self-pay | Admitting: Cardiology

## 2019-08-16 ENCOUNTER — Ambulatory Visit: Payer: Medicare Other | Admitting: Cardiology

## 2019-08-16 VITALS — BP 110/70 | HR 72 | Ht 71.0 in | Wt 279.0 lb

## 2019-08-16 DIAGNOSIS — Z79899 Other long term (current) drug therapy: Secondary | ICD-10-CM

## 2019-08-16 DIAGNOSIS — I4819 Other persistent atrial fibrillation: Secondary | ICD-10-CM

## 2019-08-16 NOTE — Progress Notes (Signed)
Electrophysiology Office Note   Date:  08/18/2019   ID:  [name] Sanford, DOB 1956/01/03, MRN [id number]  PCP:  Dustin Flock, PA-C  Cardiologist:  Agustin Cree Primary Electrophysiologist:  Analysa Nutting Meredith Leeds, MD    Chief Complaint: AF   History of Present Illness: [name] Sanford is a 64 y.o. male who is being seen today for the evaluation of AF at the request of No ref. provider found. Presenting today for electrophysiology evaluation.  He has a history of COPD, hypertension, diabetes, tobacco abuse, diastolic heart failure, and paroxysmal atrial fibrillation.  He was diagnosed with A. fib 05/09/2019 after presenting the emergency room with symptoms of shortness of breath.  He was found to be volume overloaded and was admitted to the hospital.  He was started on Xarelto for a CHA2DS2-VASc of 3.  He converted to sinus rhythm the night of admission.  He began to have fatigue symptoms 05/17/2019.  He is status post dofetilide load 06/11/2019.  He converted to sinus rhythm with medication and did not require cardioversion.  Today, he denies symptoms of palpitations, chest pain, shortness of breath, orthopnea, PND, lower extremity edema, claudication, dizziness, presyncope, syncope, bleeding, or neurologic sequela. The patient is tolerating medications without difficulties.    Past Medical History:  Diagnosis Date  . Arthritis   . Atrial fibrillation ([place])   . Back pain   . COPD (chronic obstructive pulmonary disease) ([place])   . Visit for monitoring Tikosyn therapy 06/08/2019   Past Surgical History:  Procedure Laterality Date  . BRAIN SURGERY    . NECK SURGERY    . SHOULDER SURGERY    . SPINAL CORD STIMULATOR IMPLANT       Current Outpatient Medications  Medication Sig Dispense Refill  . albuterol (PROVENTIL) (2.5 MG/3ML) 0.083% nebulizer solution Inhale 3 mLs into the lungs every 4 (four) hours as needed for shortness of breath.    . ATROVENT HFA 17 MCG/ACT inhaler Inhale 2 puffs into  the lungs in the morning, at noon, in the evening, and at bedtime.     . dofetilide (TIKOSYN) 500 MCG capsule Take 1 capsule (500 mcg total) by mouth 2 (two) times daily. 60 capsule 3  . esomeprazole (NEXIUM) 20 MG capsule Take 20 mg by mouth daily at 12 noon.    . furosemide (LASIX) 40 MG tablet Take 14m in the AM and 457min the PM 75 tablet 6  . HYDROmorphone HCl ER 16 MG TB24 Take 16 mg by mouth daily.     . Marland KitchenINZESS 290 MCG CAPS capsule Take 290 mcg by mouth every morning.    . mometasone (ELOCON) 0.1 % cream Apply 1 application topically as needed (rosacea).     . Marland Kitchenxycodone (ROXICODONE) 30 MG immediate release tablet Take 30 mg by mouth every 4 (four) hours as needed for pain.    . rivaroxaban (XARELTO) 20 MG TABS tablet Take 1 tablet (20 mg total) by mouth daily with supper. 30 tablet 3  . testosterone cypionate (DEPOTESTOSTERONE CYPIONATE) 200 MG/ML injection Inject 200 mg into the muscle every 14 (fourteen) days.    . Grant RutsNHUB 250-50 MCG/DOSE AEPB Inhale 1 puff into the lungs 2 (two) times daily.     No current facility-administered medications for this visit.    Allergies:   Oxymorphone   Social History:  The patient  reports that he has been smoking cigarettes. He has a 45.00 pack-year smoking history. He has never used smokeless tobacco. He reports previous alcohol use. He  reports that he does not use drugs.   Family History:  The patient's family history includes Cancer in his father and mother; Heart disease in his father.    ROS:  Please see the history of present illness.   Otherwise, review of systems is positive for none.   All other systems are reviewed and negative.    PHYSICAL EXAM: VS:  BP 110/70   Pulse 72   Ht _0  (1.803 m)   Wt 279 lb (126.6 kg)   SpO2 95%   BMI 38.91 kg/m  , BMI Body mass index is 38.91 kg/m. GEN: Well nourished, well developed, in no acute distress  HEENT: normal  Neck: no JVD, carotid bruits, or masses Cardiac: RRR; no murmurs,  rubs, or gallops,no edema  Respiratory:  clear to auscultation bilaterally, normal work of breathing GI: soft, nontender, nondistended, + BS MS: no deformity or atrophy  Skin: warm and dry Neuro:  Strength and sensation are intact Psych: euthymic mood, full affect  EKG:  EKG is ordered today. Personal review of the ekg ordered shows sinus rhythm, rate 72  Recent Labs: 05/09/2019: B Natriuretic Peptide 247.7; TSH 0.720 05/10/2019: ALT 17; Hemoglobin 14.3; Platelets 264 06/18/2019: Magnesium 2.2 07/13/2019: BUN 15; Creatinine, Ser 1.22; Potassium 4.4; Sodium 139    Lipid Panel     Component Value Date/Time   CHOL 191 05/09/2019 1537   TRIG 63 05/09/2019 1537   HDL 47 05/09/2019 1537   CHOLHDL 4.1 05/09/2019 1537   VLDL 13 05/09/2019 1537   LDLCALC 131 (H) 05/09/2019 1537     Wt Readings from Last 3 Encounters:  08/16/19 279 lb (126.6 kg)  07/13/19 284 lb 6.4 oz (129 kg)  07/04/19 277 lb (125.6 kg)      Other studies Reviewed: Additional studies/ records that were reviewed today include: TTE 05/09/2019 Review of the above records today demonstrates:  1. Poor acoustic windows limit study.  2. Left ventricular ejection fraction, by estimation, is 55 to 60%. The  left ventricle has normal function. The left ventricle has no regional  wall motion abnormalities. There is moderate left ventricular hypertrophy.  Left ventricular diastolic  parameters are indeterminate.  3. Right ventricular systolic function is normal. The right ventricular  size is normal.  4. The mitral valve is abnormal. No evidence of mitral valve  regurgitation.  5. The aortic valve is normal in structure. Aortic valve regurgitation is  not visualized.  6. The inferior vena cava is dilated in size with <50% respiratory  variability, suggesting right atrial pressure of 15 mmHg.    ASSESSMENT AND PLAN:  1.  Persistent atrial fibrillation: CHA2DS2-VASc of 3.  Currently on dofetilide and Xarelto.  He  remains in sinus rhythm.  We [name] Sanford continue Tikosyn at the current dose.Labs from May 2021 shows stable potassium.  His ECG shows a QTC quite a bit less than 500.  This is monitoring for a high risk medication.  2.  Obesity: BMI of almost 40.  Lifestyle modification encouraged.  3.  Chronic diastolic heart failure: No obvious volume overload.  Has follow-up with general cardiology.    Current medicines are reviewed at length with the patient today.   The patient does not have concerns regarding his medicines.  The following changes were made today:  none  Labs/ tests ordered today include:  Orders Placed This Encounter  Procedures  . EKG 12-Lead     Disposition:   FU with [name] Sanford 6 months  Signed, Trelyn Vanderlinde  Meredith Leeds, MD  08/18/2019 7:08 AM     [facility] [address] [place] [address] [phone] (office) [phone] (fax)

## 2019-08-16 NOTE — Patient Instructions (Signed)
Medication Instructions:  Your physician recommends that you continue on your current medications as directed. Please refer to the Current Medication list given to you today.  *If you need a refill on your cardiac medications before your next appointment, please call your pharmacy*   Lab Work: None ordered   Testing/Procedures: None ordered   Follow-Up: At [facility], you and your health needs are our priority.  As part of our continuing mission to provide you with exceptional heart care, we have created designated Provider Care Teams.  These Care Teams include your primary Cardiologist (physician) and Advanced Practice Providers (APPs -  Physician Assistants and Nurse Practitioners) who all work together to provide you with the care you need, when you need it.  We recommend signing up for the patient portal called "MyChart".  Sign up information is provided on this After Visit Summary.  MyChart is used to connect with patients for Virtual Visits (Telemedicine).  Patients are able to view lab/test results, encounter notes, upcoming appointments, etc.  Non-urgent messages can be sent to your provider as well.   To learn more about what you can do with MyChart, go to [email or web address].    Your next appointment:   6 month(s)  The format for your next appointment:   In Person  Provider:   Allegra Lai, MD   Thank you for choosing [facility]!!   Trinidad Curet, RN [phone]    Other Instructions

## 2019-08-18 ENCOUNTER — Encounter: Payer: Self-pay | Admitting: Cardiology

## 2019-08-24 ENCOUNTER — Ambulatory Visit: Payer: Medicare Other | Admitting: Cardiology

## 2019-08-29 ENCOUNTER — Encounter (HOSPITAL_COMMUNITY): Payer: Self-pay | Admitting: Emergency Medicine

## 2019-08-29 ENCOUNTER — Emergency Department (HOSPITAL_COMMUNITY)
Admission: EM | Admit: 2019-08-29 | Discharge: 2019-08-30 | Disposition: A | Discharge status: Home / Self Care | Payer: Medicare Other | Attending: Emergency Medicine | Admitting: Emergency Medicine

## 2019-08-29 ENCOUNTER — Other Ambulatory Visit: Payer: Self-pay

## 2019-08-29 DIAGNOSIS — Z79899 Other long term (current) drug therapy: Secondary | ICD-10-CM | POA: Diagnosis not present

## 2019-08-29 DIAGNOSIS — F17210 Nicotine dependence, cigarettes, uncomplicated: Secondary | ICD-10-CM | POA: Diagnosis not present

## 2019-08-29 DIAGNOSIS — I509 Heart failure, unspecified: Secondary | ICD-10-CM | POA: Insufficient documentation

## 2019-08-29 DIAGNOSIS — R1033 Periumbilical pain: Secondary | ICD-10-CM | POA: Diagnosis present

## 2019-08-29 DIAGNOSIS — K420 Umbilical hernia with obstruction, without gangrene: Secondary | ICD-10-CM | POA: Diagnosis not present

## 2019-08-29 DIAGNOSIS — J449 Chronic obstructive pulmonary disease, unspecified: Secondary | ICD-10-CM | POA: Insufficient documentation

## 2019-08-29 LAB — CBC
HCT: 47.3 % (ref 39.0–52.0)
Hemoglobin: 14.7 g/dL (ref 13.0–17.0)
MCH: 27.9 pg (ref 26.0–34.0)
MCHC: 31.1 g/dL (ref 30.0–36.0)
MCV: 89.9 fL (ref 80.0–100.0)
Platelets: 225 10*3/uL (ref 150–400)
RBC: 5.26 MIL/uL (ref 4.22–5.81)
RDW: 15 % (ref 11.5–15.5)
WBC: 8 10*3/uL (ref 4.0–10.5)
nRBC: 0 % (ref 0.0–0.2)

## 2019-08-29 LAB — COMPREHENSIVE METABOLIC PANEL
ALT: 17 U/L (ref 0–44)
AST: 18 U/L (ref 15–41)
Albumin: 3.6 g/dL (ref 3.5–5.0)
Alkaline Phosphatase: 75 U/L (ref 38–126)
Anion gap: 7 (ref 5–15)
BUN: 12 mg/dL (ref 8–23)
CO2: 34 mmol/L — ABNORMAL HIGH (ref 22–32)
Calcium: 9 mg/dL (ref 8.9–10.3)
Chloride: 97 mmol/L — ABNORMAL LOW (ref 98–111)
Creatinine, Ser: 1.19 mg/dL (ref 0.61–1.24)
GFR calc Af Amer: >60 mL/min (ref >60)
GFR calc non Af Amer: >60 mL/min (ref >60)
Glucose, Bld: 131 mg/dL — ABNORMAL HIGH (ref 70–99)
Potassium: 4.3 mmol/L (ref 3.5–5.1)
Sodium: 138 mmol/L (ref 135–145)
Total Bilirubin: 0.1 mg/dL — ABNORMAL LOW (ref 0.3–1.2)
Total Protein: 7.3 g/dL (ref 6.5–8.1)

## 2019-08-29 LAB — URINALYSIS, ROUTINE W REFLEX MICROSCOPIC
Bilirubin Urine: NEGATIVE
Glucose, UA: NEGATIVE mg/dL
Hgb urine dipstick: NEGATIVE
Ketones, ur: NEGATIVE mg/dL
Leukocytes,Ua: NEGATIVE
Nitrite: NEGATIVE
Protein, ur: NEGATIVE mg/dL
Specific Gravity, Urine: 1.009 (ref 1.005–1.030)
pH: 5 (ref 5.0–8.0)

## 2019-08-29 LAB — LIPASE, BLOOD: Lipase: 19 U/L (ref 11–51)

## 2019-08-29 MED ORDER — SODIUM CHLORIDE 0.9% FLUSH
3.0000 mL | Freq: Once | INTRAVENOUS | Status: AC
  Administered 2019-08-30: 3 mL via INTRAVENOUS

## 2019-08-29 NOTE — ED Provider Notes (Signed)
64 year old male received a signout from [place] pending CT imaging.  Per her HPI:  "[name] Sanford is a 64 y.o. male.  The history is provided by the patient. No language interpreter was used.  Abdominal Pain Pain location:  Periumbilical Pain quality: aching   Pain radiates to:  Periumbilical region Pain severity:  Moderate Onset quality:  Gradual Timing:  Constant Progression:  Worsening Chronicity:  New Relieved by:  Nothing Worsened by:  Nothing Ineffective treatments:  None tried Pt complains of swelling at umbilicus for the last 3 weeks.  Pt reports he noticed a hernia several weeks ago. Pt reports area has increased in size today.  Pt is on chronic pain medication and pain is unrelieved by medication"   Physical Exam  BP (!) 150/75 (BP Location: Left Arm)   Pulse 65   Temp 97.9 F (36.6 C) (Oral)   Resp 18   SpO2 94%   Physical Exam Vitals and nursing note reviewed.  Constitutional:      Appearance: He is well-developed.     Comments: Uncomfortable appearing  HENT:     Head: Normocephalic and atraumatic.  Pulmonary:     Effort: Pulmonary effort is normal.  Abdominal:     Tenderness: There is abdominal tenderness.     Hernia: A hernia is present. Hernia is present in the umbilical area.     Comments: Periumbilical tenderness to palpation.  Abdomen is distended, but soft.  Normoactive bowel sounds.  Musculoskeletal:     Cervical back: Neck supple.  Neurological:     Mental Status: He is alert.     Cranial Nerves: No cranial nerve deficit.  Psychiatric:        Behavior: Behavior normal.     ED Course/Procedures     Procedures  MDM   64 year old male received at signout from [place] pending CT A/P.  Please see her note for further work-up and medical decision making.  In brief, this is a 64 year old male with chronic back pain who presents with abdominal swelling and a known umbilical hernia.  01:00 -CT scan reviewed by me.  There was concern for small  fat containing umbilical hernia with clinical correlation recommended.  On my evaluation, he is tender to palpation in the periumbilical region that corresponds with fat-containing hernia on exam.  Patient is sitting upright and appears to be in a considerable amount of pain.  Will order pain medication.  He is also audibly wheezing and DuoNeb has been ordered.  I spoke with Dr. Kae Heller with general surgery who recommends pain control and outpatient follow-up with general surgery since hernia contains fat and not bowel.  01:52-Patient recheck.  He continues to endorse severe pain requiring him to sit on the edge of the bed.  He did receive 1 mg of Dilaudid with minimal improvement in his pain.  Patient's medical record has been reviewed.  He is on 30 mg of oxycodone every 4 hours at home as needed for pain.  He is overdue for his home pain medication.  We will give a dose of his home pain medication and reevaluate.  Following a dose of his home pain medication, he reports that pain is markedly improved and he is feeling much better and ready for discharge.  On reevaluation, he is much more comfortable appearing.  Wheezing has markedly improved.  Encouraged him to continue his home pain medication.  He declines antiemetics for home use.  General surgery consult given.  He is hemodynamically stable  to no acute distress.  Safe for discharge to home with outpatient follow-up as indicated.      Joanne Gavel, PA-C 08/30/19 0257    Ripley Fraise, MD 08/30/19 [phone]

## 2019-08-29 NOTE — ED Provider Notes (Signed)
[facility] EMERGENCY DEPARTMENT Provider Note   CSN: [id number] Arrival date & time: 08/29/19  1745     History Chief Complaint  Patient presents with  . Abdominal Pain    [name] Sanford is a 64 y.o. male.  The history is provided by the patient. No language interpreter was used.  Abdominal Pain Pain location:  Periumbilical Pain quality: aching   Pain radiates to:  Periumbilical region Pain severity:  Moderate Onset quality:  Gradual Timing:  Constant Progression:  Worsening Chronicity:  New Relieved by:  Nothing Worsened by:  Nothing Ineffective treatments:  None tried Pt complains of swelling at umbilicus for the last 3 weeks.  Pt reports he noticed a hernia several weeks ago. Pt reports area has increased in size today.  Pt is on chronic pain medication and pain is unrelieved by medication      Past Medical History:  Diagnosis Date  . Arthritis   . Atrial fibrillation ([place])   . Back pain   . COPD (chronic obstructive pulmonary disease) ([place])   . Visit for monitoring Tikosyn therapy 06/08/2019    Patient Active Problem List   Diagnosis Date Noted  . Persistent atrial fibrillation ([place]) 06/08/2019  . Paroxysmal atrial fibrillation ([place]) 05/18/2019  . Atrial flutter ([place]) 05/18/2019  . Secondary hypercoagulable state ([place]) 05/18/2019  . Pain in right knee 05/13/2019  . Acute exacerbation of CHF (congestive heart failure) ([place]) 05/09/2019  . Abscess of right groin 08/25/2018  . Right inguinal pain 08/25/2018  . Malfunction of spinal cord stimulator ([place]) 04/01/2017  . Post laminectomy syndrome 04/01/2017  . Hx of lumbosacral spine surgery 12/31/2016  . Dyslipidemia 07/19/2016  . Precordial pain 07/19/2016  . Smoking 07/19/2016  . Hematuria, gross 06/19/2016  . Recurrent UTI 06/04/2016  . Low back pain 09/13/2014  . Status post lumbar spinal fusion 09/13/2014  . Bilateral thoracic back pain 12/10/2013  . Chronic back pain 12/10/2013  .  Mechanical complication nervous system device/implant/graft 12/10/2013    Past Surgical History:  Procedure Laterality Date  . BRAIN SURGERY    . NECK SURGERY    . SHOULDER SURGERY    . SPINAL CORD STIMULATOR IMPLANT         Family History  Problem Relation Age of Onset  . Cancer Mother   . Heart disease Father   . Cancer Father     Social History   Tobacco Use  . Smoking status: Current Every Day Smoker    Packs/day: 1.00    Years: 45.00    Pack years: 45.00    Types: Cigarettes  . Smokeless tobacco: Never Used  . Tobacco comment: 6-7 cigarettes daily  Vaping Use  . Vaping Use: Former  Substance Use Topics  . Alcohol use: Not Currently  . Drug use: Never    Home Medications Prior to Admission medications   Medication Sig Start Date End Date Taking? Authorizing Provider  albuterol (PROVENTIL) (2.5 MG/3ML) 0.083% nebulizer solution Inhale 3 mLs into the lungs every 4 (four) hours as needed for shortness of breath. 04/16/19   [provider]  ATROVENT HFA 17 MCG/ACT inhaler Inhale 2 puffs into the lungs in the morning, at noon, in the evening, and at bedtime.  05/11/19   [provider]  cefdinir (OMNICEF) 300 MG capsule Take by mouth. 08/19/19   [provider]  dofetilide (TIKOSYN) 500 MCG capsule Take 1 capsule (500 mcg total) by mouth 2 (two) times daily. 07/07/19   Fenton, Clint  R, PA  esomeprazole (NEXIUM) 20 MG capsule Take 20 mg by mouth daily at 12 noon.    [provider]  furosemide (LASIX) 40 MG tablet Take 4m in the AM and 491min the PM 07/13/19   Fenton, Clint R, PA  HYDROmorphone HCl ER 16 MG TB24 Take 16 mg by mouth daily.  04/30/19   [provider]  LINZESS 290 MCG CAPS capsule Take 290 mcg by mouth every morning. 03/04/19   [provider]  mometasone (ELOCON) 0.1 % cream Apply 1 application topically as needed (rosacea).  06/01/19   [provider]  oxycodone (ROXICODONE) 30 MG immediate  release tablet Take 30 mg by mouth every 4 (four) hours as needed for pain. 04/30/19   [provider]  predniSONE (DELTASONE) 20 MG tablet Take 40 mg by mouth every morning. 07/28/19   [provider]  rivaroxaban (XARELTO) 20 MG TABS tablet Take 1 tablet (20 mg total) by mouth daily with supper. 06/11/19   Bhagat, BhCrista LuriaPA  testosterone cypionate (DEPOTESTOSTERONE CYPIONATE) 200 MG/ML injection Inject 200 mg into the muscle every 14 (fourteen) days. 04/27/19   [provider]  WIGrant RutsNHUB 250-50 MCG/DOSE AEPB Inhale 1 puff into the lungs 2 (two) times daily. 04/29/19   [provider]    Allergies    Oxymorphone  Review of Systems   Review of Systems  Gastrointestinal: Positive for abdominal pain.  All other systems reviewed and are negative.   Physical Exam Updated Vital Signs BP 140/88   Pulse 68   Temp 98.2 F (36.8 C) (Oral)   Resp 18   SpO2 93%   Physical Exam Vitals and nursing note reviewed.  Constitutional:      Appearance: He is well-developed.  HENT:     Head: Normocephalic and atraumatic.  Eyes:     Conjunctiva/sclera: Conjunctivae normal.  Cardiovascular:     Rate and Rhythm: Normal rate and regular rhythm.     Heart sounds: No murmur heard.   Pulmonary:     Effort: Pulmonary effort is normal. No respiratory distress.     Breath sounds: Normal breath sounds.  Abdominal:     General: Bowel sounds are normal.     Palpations: Abdomen is soft.     Tenderness: There is no abdominal tenderness.     Hernia: A hernia is present. Hernia is present in the umbilical area.  Musculoskeletal:     Cervical back: Neck supple.  Skin:    General: Skin is warm and dry.  Neurological:     General: No focal deficit present.     Mental Status: He is alert.     ED Results / Procedures / Treatments   Labs (all labs ordered are listed, but only abnormal results are displayed) Labs Reviewed  COMPREHENSIVE METABOLIC PANEL - Abnormal;  Notable for the following components:      Result Value   Chloride 97 (*)    CO2 34 (*)    Glucose, Bld 131 (*)    Total Bilirubin 0.1 (*)    All other components within normal limits  LIPASE, BLOOD  CBC  URINALYSIS, ROUTINE W REFLEX MICROSCOPIC    EKG None  Radiology No results found.  Procedures Procedures (including critical care time)  Medications Ordered in ED Medications  sodium chloride flush (NS) 0.9 % injection 3 mL (has no administration in time range)    ED Course  I have reviewed the triage vital signs and the nursing notes.  Pertinent labs & imaging results that were available during my care of the patient were reviewed by me and considered in my medical decision making (see chart for details).    MDM Rules/Calculators/A&P                          MDM:  Pt care turned over to [place] PA-C  Ct pending  Final Clinical Impression(s) / ED Diagnoses Final diagnoses:  Generalized abdominal pain    Rx / DC Orders ED Discharge Orders    None       Sidney Ace 08/29/19 2343    Pattricia Boss, MD 08/30/19 1216

## 2019-08-29 NOTE — ED Triage Notes (Signed)
Pt c/o below the belly button pain that started this morning while trying to have a bowel movement. Pt reports decreased urine output with a strong odor as well.

## 2019-08-30 ENCOUNTER — Emergency Department (HOSPITAL_COMMUNITY): Payer: Medicare Other

## 2019-08-30 MED ORDER — ALBUTEROL SULFATE (2.5 MG/3ML) 0.083% IN NEBU
5.0000 mg | INHALATION_SOLUTION | Freq: Once | RESPIRATORY_TRACT | Status: AC
  Administered 2019-08-30: 5 mg via RESPIRATORY_TRACT
  Filled 2019-08-30: qty 6

## 2019-08-30 MED ORDER — OXYCODONE HCL 5 MG PO TABS
30.0000 mg | ORAL_TABLET | Freq: Once | ORAL | Status: AC
  Administered 2019-08-30: 30 mg via ORAL
  Filled 2019-08-30: qty 6

## 2019-08-30 MED ORDER — IOHEXOL 300 MG/ML  SOLN
125.0000 mL | Freq: Once | INTRAMUSCULAR | Status: AC | PRN
  Administered 2019-08-30: 125 mL via INTRAVENOUS

## 2019-08-30 MED ORDER — HYDROMORPHONE HCL 1 MG/ML IJ SOLN
1.0000 mg | Freq: Once | INTRAMUSCULAR | Status: AC
  Administered 2019-08-30: 1 mg via INTRAVENOUS
  Filled 2019-08-30: qty 1

## 2019-08-30 NOTE — Discharge Instructions (Signed)
Thank you for allowing me to care for you today in the Emergency Department.   Continue to take your home pain medication as prescribed.  Please call to schedule a follow-up appointment with general surgery.  Their contact information is listed above.  You do have a hernia that has some fat "stuck" in it, but this does not need to be repaired emergently.  Your work-up is otherwise reassuring.  Return to the emergency department if you develop uncontrollable pain, uncontrollable vomiting, abdominal pain with high fevers, or other new, concerning symptoms.

## 2019-09-03 ENCOUNTER — Ambulatory Visit: Payer: Medicare Other | Admitting: Emergency Medicine

## 2019-09-03 ENCOUNTER — Other Ambulatory Visit: Payer: Self-pay

## 2019-09-03 ENCOUNTER — Encounter: Payer: Self-pay | Admitting: Emergency Medicine

## 2019-09-03 DIAGNOSIS — G4734 Idiopathic sleep related nonobstructive alveolar hypoventilation: Secondary | ICD-10-CM | POA: Insufficient documentation

## 2019-09-03 DIAGNOSIS — J449 Chronic obstructive pulmonary disease, unspecified: Secondary | ICD-10-CM | POA: Diagnosis not present

## 2019-09-03 DIAGNOSIS — F17200 Nicotine dependence, unspecified, uncomplicated: Secondary | ICD-10-CM

## 2019-09-03 HISTORY — DX: Idiopathic sleep related nonobstructive alveolar hypoventilation: G47.34

## 2019-09-03 MED ORDER — STIOLTO RESPIMAT 2.5-2.5 MCG/ACT IN AERS
2.0000 | INHALATION_SPRAY | Freq: Every day | RESPIRATORY_TRACT | 0 refills | Status: DC
Start: 2019-09-03 — End: 2020-07-20

## 2019-09-03 NOTE — Assessment & Plan Note (Signed)
Suspect significant COPD given his tobacco history.  Currently managed on Wixela.  He does not exacerbate frequently, does not have purulent sputum and a believe we may be able to change him to LABA/LAMA and get improvement in his breathing, functional capacity.  We will try Stiolto to see if he benefits.  Obtain full pulmonary function testing to quantify degree of obstruction.

## 2019-09-03 NOTE — Patient Instructions (Addendum)
We will perform full pulmonary function testing at your next office visit Stop Wixela. Start Stiolto 2 puffs once a day until next visit.  Keep track of whether you feel this medication benefit you. Keep albuterol available to use either 2 puffs or 1 nebulizer treatment up to every 4 hours if needed for shortness of breath, chest tightness, wheezing. Walking oximetry on room air today We will arrange for an overnight oximetry test on room air You would benefit from stopping smoking.  We will try to help you in any way possible. Follow with Dr. Lamonte Sakai next available with full pulmonary function testing on the same day.

## 2019-09-03 NOTE — Progress Notes (Signed)
Subjective:    Patient ID: [name] Sanford, male    DOB: 1955/05/28, 64 y.o.   MRN: [id number]  HPI 64 year old obese smoker (45 pack years) with a history of atrial fibrillation on Xarelto and dofetilide, hypertension with diastolic CHF, diabetes.  Carries a diagnosis of COPD that was made many years ago.  He is currently on Wixela, has albuterol which he uses a few times a week usually unless it is hot weather when he uses it more. He is able to exert and do most activities, mostly limited by his back pain. No cough. He does hear some wheeze, with exertion, with URI's. No sputum. Occasional nocturnal awakenings due to his back pain.  Sleep study done on 07/04/2019 reviewed by me showed no obstructive apneas (AHI 0) or central apneas.  He did have snoring and mild transient desaturation to 81%.   Review of Systems As per HPI  Past Medical History:  Diagnosis Date  . Arthritis   . Atrial fibrillation ([place])   . Back pain   . COPD (chronic obstructive pulmonary disease) ([place])   . Visit for monitoring Tikosyn therapy 06/08/2019     Family History  Problem Relation Age of Onset  . Cancer Mother   . Heart disease Father   . Cancer Father      Social History   Socioeconomic History  . Marital status: Married    Spouse name: Not on file  . Number of children: Not on file  . Years of education: Not on file  . Highest education level: Not on file  Occupational History  . Not on file  Tobacco Use  . Smoking status: Current Every Day Smoker    Packs/day: 1.00    Years: 45.00    Pack years: 45.00    Types: Cigarettes  . Smokeless tobacco: Never Used  . Tobacco comment: 20 cigarettes daily 09/03/19 ARJ   Vaping Use  . Vaping Use: Former  Substance and Sexual Activity  . Alcohol use: Not Currently  . Drug use: Never  . Sexual activity: Not on file  Other Topics Concern  . Not on file  Social History Narrative  . Not on file   Social Determinants of Health   Financial  Resource Strain:   . Difficulty of Paying Living Expenses:   Food Insecurity:   . Worried About Charity fundraiser in the Last Year:   . Arboriculturist in the Last Year:   Transportation Needs:   . Film/video editor (Medical):   Marland Kitchen Lack of Transportation (Non-Medical):   Physical Activity:   . Days of Exercise per Week:   . Minutes of Exercise per Session:   Stress:   . Feeling of Stress :   Social Connections:   . Frequency of Communication with Friends and Family:   . Frequency of Social Gatherings with Friends and Family:   . Attends Religious Services:   . Active Member of Clubs or Organizations:   . Attends Archivist Meetings:   Marland Kitchen Marital Status:   Intimate Partner Violence:   . Fear of Current or Ex-Partner:   . Emotionally Abused:   Marland Kitchen Physically Abused:   . 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 Oxymorphone Other (See Comments)    Flu like symptoms, oral blisters; Opana  Outpatient Medications Prior to Visit  Medication Sig Dispense Refill  . albuterol (PROVENTIL) (2.5 MG/3ML) 0.083% nebulizer solution Inhale 3 mLs into the lungs every 4 (four) hours as needed for shortness of breath.    . dofetilide (TIKOSYN) 500 MCG capsule Take 1 capsule (500 mcg total) by mouth 2 (two) times daily. 60 capsule 3  . esomeprazole (NEXIUM) 20 MG capsule Take 20 mg by mouth daily.     . furosemide (LASIX) 40 MG tablet Take 27m in the AM and 445min the PM (Patient taking differently: Take 40-60 mg by mouth See admin instructions. Take 1 and 1/2 tablets in the morning and take 1 tablet at lunch) 75 tablet 6  . HYDROmorphone HCl ER 16 MG TB24 Take 16 mg by mouth daily.     . Marland KitchenINZESS 290 MCG CAPS capsule Take 290 mcg by mouth daily before breakfast.     . mometasone (ELOCON) 0.1 % cream Apply 1 application topically as needed  (rosacea).     . Marland Kitchenxycodone (ROXICODONE) 30 MG immediate release tablet Take 30 mg by mouth every 4 (four) hours as needed for pain.    . rivaroxaban (XARELTO) 20 MG TABS tablet Take 1 tablet (20 mg total) by mouth daily with supper. 30 tablet 3  . testosterone cypionate (DEPOTESTOSTERONE CYPIONATE) 200 MG/ML injection Inject 200 mg into the muscle every 14 (fourteen) days.    . Grant RutsNHUB 250-50 MCG/DOSE AEPB Inhale 1 puff into the lungs 2 (two) times daily.     No facility-administered medications prior to visit.        Objective:   Physical Exam Vitals:   09/03/19 1413  BP: 138/76  Pulse: 77  Temp: 98.2 F (36.8 C)  TempSrc: Oral  SpO2: 95%  Weight: 277 lb 3.2 oz (125.7 kg)  Height: 6' (1.829 m)   Gen: Pleasant, obese man, in no distress,  normal affect  ENT: No lesions,  mouth clear,  oropharynx clear, no postnasal drip, hoarse voice  Neck: No JVD, no stridor  Lungs: No use of accessory muscles, scattered inspiratory and expiratory wheezes  Cardiovascular: RRR, heart sounds normal, no murmur or gallops, 1+ pretibial peripheral edema  Musculoskeletal: No deformities, no cyanosis or clubbing  Neuro: alert, awake, non focal  Skin: Warm, no lesions or rash     Assessment & Plan:  COPD (chronic obstructive pulmonary disease) (HCC) Suspect significant COPD given his tobacco history.  Currently managed on Wixela.  He does not exacerbate frequently, does not have purulent sputum and a believe we may be able to change him to LABA/LAMA and get improvement in his breathing, functional capacity.  We will try Stiolto to see if he benefits.  Obtain full pulmonary function testing to quantify degree of obstruction.  Smoking Discussed cessation with him today in detail.  He is not ready to set a quit date but he would be willing to try to cut down.  We set a goal to get 15 cigarettes daily by his next visit.  Nocturnal hypoxemia Identified during his sleep study which did not  show any obstructive or central apneas.  We will arrange for an overnight oximetry on room air to see if there is clinically significant desaturation.  If so we will arrange for nocturnal oxygen.  We will also perform a walking oximetry today to ensure no evidence of occult exertional hypoxemia.  RoBaltazar ApoMD, PhD 09/03/2019, 2:31 PM [facility] [phone] if no answer [phone]

## 2019-09-03 NOTE — Assessment & Plan Note (Signed)
Identified during his sleep study which did not show any obstructive or central apneas.  We will arrange for an overnight oximetry on room air to see if there is clinically significant desaturation.  If so we will arrange for nocturnal oxygen.  We will also perform a walking oximetry today to ensure no evidence of occult exertional hypoxemia.

## 2019-09-03 NOTE — Addendum Note (Signed)
Addended by: Gavin Potters R on: 09/03/2019 02:48 PM   Modules accepted: Orders

## 2019-09-03 NOTE — Assessment & Plan Note (Signed)
Discussed cessation with him today in detail.  He is not ready to set a quit date but he would be willing to try to cut down.  We set a goal to get 15 cigarettes daily by his next visit.

## 2019-10-11 ENCOUNTER — Other Ambulatory Visit (HOSPITAL_COMMUNITY): Payer: Self-pay | Admitting: *Deleted

## 2019-10-11 MED ORDER — RIVAROXABAN 20 MG PO TABS: 20.0000 mg | ORAL_TABLET | Freq: Every day | ORAL | 3 refills | Status: DC

## 2019-10-22 ENCOUNTER — Ambulatory Visit (HOSPITAL_COMMUNITY): Payer: Medicare Other | Admitting: Physician Assistant

## 2019-10-26 ENCOUNTER — Ambulatory Visit (HOSPITAL_COMMUNITY): Payer: Medicare Other | Admitting: Physician Assistant

## 2019-11-01 ENCOUNTER — Other Ambulatory Visit (HOSPITAL_COMMUNITY): Payer: Medicare Other

## 2019-11-02 ENCOUNTER — Inpatient Hospital Stay (HOSPITAL_COMMUNITY): Admission: RE | Admit: 2019-11-02 | Payer: Medicare Other | Source: Ambulatory Visit | Admitting: Physician Assistant

## 2019-11-03 ENCOUNTER — Ambulatory Visit: Payer: Medicare Other | Admitting: Emergency Medicine

## 2019-11-08 ENCOUNTER — Other Ambulatory Visit (HOSPITAL_COMMUNITY): Payer: Self-pay | Admitting: *Deleted

## 2019-11-08 ENCOUNTER — Other Ambulatory Visit (HOSPITAL_COMMUNITY): Payer: Self-pay | Admitting: Physician Assistant

## 2019-11-08 MED ORDER — DOFETILIDE 500 MCG PO CAPS: 500.0000 ug | ORAL_CAPSULE | Freq: Two times a day (BID) | ORAL | 0 refills | Status: DC

## 2019-11-10 ENCOUNTER — Ambulatory Visit (HOSPITAL_COMMUNITY): Payer: Medicare Other | Admitting: Physician Assistant

## 2019-11-11 ENCOUNTER — Encounter (HOSPITAL_COMMUNITY): Payer: Self-pay | Admitting: Physician Assistant

## 2019-11-11 ENCOUNTER — Other Ambulatory Visit: Payer: Self-pay

## 2019-11-11 ENCOUNTER — Ambulatory Visit (HOSPITAL_COMMUNITY)
Admission: RE | Admit: 2019-11-11 | Discharge: 2019-11-11 | Disposition: A | Discharge status: Home / Self Care | Payer: Medicare Other | Source: Ambulatory Visit | Attending: Physician Assistant | Admitting: Physician Assistant

## 2019-11-11 VITALS — BP 118/70 | HR 58 | Ht 72.0 in | Wt 250.4 lb

## 2019-11-11 DIAGNOSIS — I4819 Other persistent atrial fibrillation: Secondary | ICD-10-CM | POA: Diagnosis not present

## 2019-11-11 DIAGNOSIS — Z7901 Long term (current) use of anticoagulants: Secondary | ICD-10-CM | POA: Diagnosis not present

## 2019-11-11 DIAGNOSIS — E119 Type 2 diabetes mellitus without complications: Secondary | ICD-10-CM | POA: Diagnosis not present

## 2019-11-11 DIAGNOSIS — E669 Obesity, unspecified: Secondary | ICD-10-CM | POA: Insufficient documentation

## 2019-11-11 DIAGNOSIS — Z6833 Body mass index (BMI) 33.0-33.9, adult: Secondary | ICD-10-CM | POA: Insufficient documentation

## 2019-11-11 DIAGNOSIS — I5030 Unspecified diastolic (congestive) heart failure: Secondary | ICD-10-CM | POA: Insufficient documentation

## 2019-11-11 DIAGNOSIS — I110 Hypertensive heart disease with heart failure: Secondary | ICD-10-CM | POA: Insufficient documentation

## 2019-11-11 DIAGNOSIS — F17210 Nicotine dependence, cigarettes, uncomplicated: Secondary | ICD-10-CM | POA: Insufficient documentation

## 2019-11-11 DIAGNOSIS — J449 Chronic obstructive pulmonary disease, unspecified: Secondary | ICD-10-CM | POA: Insufficient documentation

## 2019-11-11 DIAGNOSIS — R0902 Hypoxemia: Secondary | ICD-10-CM | POA: Insufficient documentation

## 2019-11-11 DIAGNOSIS — Z79899 Other long term (current) drug therapy: Secondary | ICD-10-CM | POA: Diagnosis not present

## 2019-11-11 DIAGNOSIS — D6869 Other thrombophilia: Secondary | ICD-10-CM | POA: Insufficient documentation

## 2019-11-11 DIAGNOSIS — Z7984 Long term (current) use of oral hypoglycemic drugs: Secondary | ICD-10-CM | POA: Diagnosis not present

## 2019-11-11 LAB — BASIC METABOLIC PANEL
Anion gap: 8 (ref 5–15)
BUN: 11 mg/dL (ref 8–23)
CO2: 29 mmol/L (ref 22–32)
Calcium: 9.1 mg/dL (ref 8.9–10.3)
Chloride: 99 mmol/L (ref 98–111)
Creatinine, Ser: 1.13 mg/dL (ref 0.61–1.24)
GFR calc Af Amer: >60 mL/min (ref >60)
GFR calc non Af Amer: >60 mL/min (ref >60)
Glucose, Bld: 95 mg/dL (ref 70–99)
Potassium: 4.8 mmol/L (ref 3.5–5.1)
Sodium: 136 mmol/L (ref 135–145)

## 2019-11-11 LAB — MAGNESIUM: Magnesium: 2.2 mg/dL (ref 1.7–2.4)

## 2019-11-11 MED ORDER — DOFETILIDE 500 MCG PO CAPS: ORAL_CAPSULE | ORAL | 1 refills | Status: DC

## 2019-11-11 NOTE — Progress Notes (Signed)
Primary Care Physician: Dustin Flock, [place] Primary Cardiologist: Dr Agustin Cree (remotely) Primary Electrophysiologist: Dr Curt Bears  Referring Physician: Dr Ronnald Ramp   [name] Sanford is a 64 y.o. male with a history of COPD, HTN, DM, tobacco abuse, diastolic CHF, and paroxysmal atrial fibrillation who presents for follow up in the [facility].  The patient was initially diagnosed with atrial fibrillation on 05/09/19 after presenting to the ED with symptoms of gradually worsening symptoms of SOB. He was found to be acutely fluid overloaded EF normal 55-60% and was admitted. Patient was started on Xarelto for a CHADS2VASC score of 3. Patient converted spontaneously to SR the night of 3/21. He reports that he has felt well until 05/17/19 when he had symptoms of fatigue. He denies significant alcohol use. He does admit to witnessed apnea and daytime somnolence. He denies any bleeding issues on anticoagulation. Patient is s/p dofetilide loading 4/20-4/23/21. He converted to SR with the medication and did not require DCCV. He was discharged on Lasix BID for his diastolic CHF.  On follow up today, patient reports he has done well since his last visit. He has not had any heart racing or palpitations. He has lost ~ 30 lbs through dieting! He denies any symptoms of fluid overload. He denies bleeding issues on anticoagulation.   Today, he denies symptoms of palpitations, chest pain, orthopnea, PND, dizziness, presyncope, syncope, bleeding, or neurologic sequela. The patient is tolerating medications without difficulties and is otherwise without complaint today.    Atrial Fibrillation Risk Factors:  he does have symptoms or diagnosis of sleep apnea. he does not have a history of rheumatic fever. he does not have a history of alcohol use. The patient does not have a history of early familial atrial fibrillation or other arrhythmias.  he has a BMI of Body mass index is 33.96  kg/m.Marland Kitchen Filed Weights   11/11/19 0844  Weight: 113.6 kg    Family History  Problem Relation Age of Onset  . Cancer Mother   . Heart disease Father   . Cancer Father      Atrial Fibrillation Management history:  Previous antiarrhythmic drugs: dofetilide Previous cardioversions: none Previous ablations: none CHADS2VASC score: 3 Anticoagulation history: Xarelto    Past Medical History:  Diagnosis Date  . Arthritis   . Atrial fibrillation ([place])   . Back pain   . COPD (chronic obstructive pulmonary disease) ([place])   . Visit for monitoring Tikosyn therapy 06/08/2019   Past Surgical History:  Procedure Laterality Date  . BRAIN SURGERY    . NECK SURGERY    . SHOULDER SURGERY    . SPINAL CORD STIMULATOR IMPLANT      Current Outpatient Medications  Medication Sig Dispense Refill  . albuterol (PROVENTIL) (2.5 MG/3ML) 0.083% nebulizer solution Inhale 3 mLs into the lungs every 4 (four) hours as needed for shortness of breath.    Marland Kitchen atorvastatin (LIPITOR) 10 MG tablet Take 10 mg by mouth daily.    Marland Kitchen dofetilide (TIKOSYN) 500 MCG capsule TAKE 1 CAPSULE(500 MCG) BY MOUTH TWICE DAILY 90 capsule 1  . furosemide (LASIX) 40 MG tablet Take 38m in the AM and 425min the PM 75 tablet 6  . HYDROmorphone HCl ER 16 MG TB24 Take 16 mg by mouth daily.     . Marland KitchenINZESS 290 MCG CAPS capsule Take 290 mcg by mouth daily before breakfast.     . metFORMIN (GLUCOPHAGE-XR) 500 MG 24 hr tablet Take 500 mg by mouth daily.    .Marland Kitchen  mometasone (ELOCON) 0.1 % cream Apply 1 application topically as needed (rosacea).     Marland Kitchen oxycodone (ROXICODONE) 30 MG immediate release tablet Take 30 mg by mouth every 4 (four) hours as needed for pain.    . rivaroxaban (XARELTO) 20 MG TABS tablet Take 1 tablet (20 mg total) by mouth daily with supper. 30 tablet 3  . terbinafine (LAMISIL) 250 MG tablet Take 250 mg by mouth daily.    Marland Kitchen testosterone cypionate (DEPOTESTOSTERONE CYPIONATE) 200 MG/ML injection Inject 200 mg into the  muscle every 14 (fourteen) days.    . Tiotropium Bromide-Olodaterol (STIOLTO RESPIMAT) 2.5-2.5 MCG/ACT AERS Inhale 2 puffs into the lungs daily. 4 g 0  . triamcinolone cream (KENALOG) 0.1 % SMARTSIG:1 Topical 3 Times Daily    . triamcinolone ointment (KENALOG) 0.5 % Apply topically 2 (two) times daily.    Grant Ruts INHUB 250-50 MCG/DOSE AEPB Inhale 1 puff into the lungs 2 (two) times daily.     No current facility-administered medications for this encounter.    Allergies  Allergen Reactions  . Oxymorphone Other (See Comments)    Flu like symptoms, oral blisters; Opana    Social History   Socioeconomic History  . Marital status: Married    Spouse name: Not on file  . Number of children: Not on file  . Years of education: Not on file  . Highest education level: Not on file  Occupational History  . Not on file  Tobacco Use  . Smoking status: Current Every Day Smoker    Packs/day: 1.00    Years: 45.00    Pack years: 45.00    Types: Cigarettes  . Smokeless tobacco: Never Used  . Tobacco comment: 20-30 cigarettes daily 09/03/19 ARJ   Vaping Use  . Vaping Use: Former  Substance and Sexual Activity  . Alcohol use: Not Currently  . Drug use: Never  . Sexual activity: Not on file  Other Topics Concern  . Not on file  Social History Narrative  . Not on file   Social Determinants of Health   Financial Resource Strain:   . Difficulty of Paying Living Expenses: Not on file  Food Insecurity:   . Worried About Charity fundraiser in the Last Year: Not on file  . Ran Out of Food in the Last Year: Not on file  Transportation Needs:   . Lack of Transportation (Medical): Not on file  . Lack of Transportation (Non-Medical): Not on file  Physical Activity:   . Days of Exercise per Week: Not on file  . Minutes of Exercise per Session: Not on file  Stress:   . Feeling of Stress : Not on file  Social Connections:   . Frequency of Communication with Friends and Family: Not on file  .  Frequency of Social Gatherings with Friends and Family: Not on file  . Attends Religious Services: Not on file  . Active Member of Clubs or Organizations: Not on file  . Attends Archivist Meetings: Not on file  . Marital Status: Not on file  Intimate Partner Violence:   . Fear of Current or Ex-Partner: Not on file  . Emotionally Abused: Not on file  . Physically Abused: Not on file  . Sexually Abused: Not on file     ROS- All systems are reviewed and negative except as per the HPI above.  Physical Exam: Vitals:   11/11/19 0844  BP: 118/70  Pulse: (!) 58  Weight: 113.6 kg  Height: 6' (  1.829 m)    GEN- The patient is well appearing obese male, alert and oriented x 3 today.   HEENT-head normocephalic, atraumatic, sclera clear, conjunctiva pink, hearing intact, trachea midline. Lungs- Clear to ausculation bilaterally, normal work of breathing Heart- Regular rate and rhythm, no murmurs, rubs or gallops  GI- soft, NT, ND, + BS Extremities- no clubbing, cyanosis, or edema MS- no significant deformity or atrophy Skin- no rash or lesion Psych- euthymic mood, full affect Neuro- strength and sensation are intact   Wt Readings from Last 3 Encounters:  11/11/19 113.6 kg  09/03/19 125.7 kg  08/16/19 126.6 kg    EKG today demonstrates SB HR 58, PR 192, QRS 90, QTc 420  Echo 05/09/19 demonstrated  1. Poor acoustic windows limit study.  2. Left ventricular ejection fraction, by estimation, is 55 to 60%. The  left ventricle has normal function. The left ventricle has no regional  wall motion abnormalities. There is moderate left ventricular hypertrophy.  Left ventricular diastolic parameters are indeterminate.  3. Right ventricular systolic function is normal. The right ventricular  size is normal.  4. The mitral valve is abnormal. No evidence of mitral valve  regurgitation.  5. The aortic valve is normal in structure. Aortic valve regurgitation is  not visualized.   6. The inferior vena cava is dilated in size with <50% respiratory  variability, suggesting right atrial pressure of 15 mmHg.   LA volume 115  Epic records are reviewed at length today  CHA2DS2-VASc Score = 3 The patient's score is based upon: CHF History: Yes HTN History: Yes Age : < 65 Diabetes History: Yes Stroke History: No Vascular Disease History: No Gender: Male   ASSESSMENT AND PLAN: 1. Persistent Atrial Fibrillation (ICD10:  I48.0) The patient's CHA2DS2-VASc score is 3, indicating a 3.2% annual risk of stroke.   S/p dofetilide loading 4/20-4/23/21. Patient appears to be maintaining SR. Continue dofetilide 500 mcg BID. QT stable.  Check bmet/mag today. Continue Xarelto 20 mg daily  2. Secondary Hypercoagulable State (ICD10:  D68.69) The patient is at significant risk for stroke/thromboembolism based upon his CHA2DS2-VASc Score of 3.  Continue Rivaroxaban (Xarelto).   3. Obesity Body mass index is 33.96 kg/m. Lifestyle modification was discussed and encouraged including regular physical activity and weight reduction. He has lost ~ 30 lbs. Congratulated.   4. Nocturnal hypoxemia Sleep study negative for OSA but there was nocturnal hypoxemia noted. Followed by Dr Lamonte Sakai.  5. Diastolic CHF No signs or symptoms of fluid overload today.  6. HTN Stable, no changes today.   Follow up with Dr Curt Bears per recall. AF clinic in 6 months.    [facility] [address] [place], [place] [address] [phone] 11/11/2019 9:11 AM

## 2019-12-08 ENCOUNTER — Other Ambulatory Visit (HOSPITAL_COMMUNITY): Payer: Self-pay | Admitting: *Deleted

## 2019-12-08 MED ORDER — DOFETILIDE 500 MCG PO CAPS: ORAL_CAPSULE | ORAL | 1 refills | Status: DC

## 2020-01-17 DIAGNOSIS — M47816 Spondylosis without myelopathy or radiculopathy, lumbar region: Secondary | ICD-10-CM

## 2020-01-17 HISTORY — DX: Spondylosis without myelopathy or radiculopathy, lumbar region: M47.816

## 2020-02-08 ENCOUNTER — Other Ambulatory Visit (HOSPITAL_COMMUNITY): Payer: Self-pay

## 2020-02-08 MED ORDER — RIVAROXABAN 20 MG PO TABS: 20.0000 mg | ORAL_TABLET | Freq: Every day | ORAL | 11 refills | Status: DC

## 2020-02-08 MED ORDER — FUROSEMIDE 40 MG PO TABS: ORAL_TABLET | ORAL | 6 refills | Status: DC

## 2020-05-10 NOTE — Progress Notes (Incomplete)
Primary Care Physician: Dustin Flock, [place] Primary Cardiologist: Dr Agustin Cree (remotely) Primary Electrophysiologist: Dr Curt Bears  Referring Physician: Dr Ronnald Ramp   [name] Sanford is a 65 y.o. male with a history of COPD, HTN, DM, tobacco abuse, diastolic CHF, and paroxysmal atrial fibrillation who presents for follow up in the [facility].  The patient was initially diagnosed with atrial fibrillation on 05/09/19 after presenting to the ED with symptoms of gradually worsening symptoms of SOB. He was found to be acutely fluid overloaded EF normal 55-60% and was admitted. Patient was started on Xarelto for a CHADS2VASC score of 3. Patient converted spontaneously to SR the night of 3/21. He reports that he has felt well until 05/17/19 when he had symptoms of fatigue. He denies significant alcohol use. He does admit to witnessed apnea and daytime somnolence. He denies any bleeding issues on anticoagulation. Patient is s/p dofetilide loading 4/20-4/23/21. He converted to SR with the medication and did not require DCCV. He was discharged on Lasix BID for his diastolic CHF.  On follow up today, ***  Today, he denies symptoms of ***palpitations, chest pain, orthopnea, PND, dizziness, presyncope, syncope, bleeding, or neurologic sequela. The patient is tolerating medications without difficulties and is otherwise without complaint today.    Atrial Fibrillation Risk Factors:  he does have symptoms or diagnosis of sleep apnea. he does not have a history of rheumatic fever. he does not have a history of alcohol use. The patient does not have a history of early familial atrial fibrillation or other arrhythmias.  he has a BMI of There is no height or weight on file to calculate BMI.. There were no vitals filed for this visit.  Family History  Problem Relation Age of Onset  . Cancer Mother   . Heart disease Father   . Cancer Father      Atrial Fibrillation Management  history:  Previous antiarrhythmic drugs: dofetilide Previous cardioversions: none Previous ablations: none CHADS2VASC score: 3 Anticoagulation history: Xarelto    Past Medical History:  Diagnosis Date  . Arthritis   . Atrial fibrillation ([place])   . Back pain   . COPD (chronic obstructive pulmonary disease) ([place])   . Visit for monitoring Tikosyn therapy 06/08/2019   Past Surgical History:  Procedure Laterality Date  . BRAIN SURGERY    . NECK SURGERY    . SHOULDER SURGERY    . SPINAL CORD STIMULATOR IMPLANT      Current Outpatient Medications  Medication Sig Dispense Refill  . albuterol (PROVENTIL) (2.5 MG/3ML) 0.083% nebulizer solution Inhale 3 mLs into the lungs every 4 (four) hours as needed for shortness of breath.    Marland Kitchen atorvastatin (LIPITOR) 10 MG tablet Take 10 mg by mouth daily.    Marland Kitchen dofetilide (TIKOSYN) 500 MCG capsule TAKE 1 CAPSULE(500 MCG) BY MOUTH TWICE DAILY 180 capsule 1  . furosemide (LASIX) 40 MG tablet Take 57m in the AM and 468min the PM 75 tablet 6  . HYDROmorphone HCl ER 16 MG TB24 Take 16 mg by mouth daily.     . Marland KitchenINZESS 290 MCG CAPS capsule Take 290 mcg by mouth daily before breakfast.     . metFORMIN (GLUCOPHAGE-XR) 500 MG 24 hr tablet Take 500 mg by mouth daily.    . mometasone (ELOCON) 0.1 % cream Apply 1 application topically as needed (rosacea).     . Marland Kitchenxycodone (ROXICODONE) 30 MG immediate release tablet Take 30 mg by mouth every 4 (four) hours as needed for  pain.    . rivaroxaban (XARELTO) 20 MG TABS tablet Take 1 tablet (20 mg total) by mouth daily with supper. 30 tablet 11  . terbinafine (LAMISIL) 250 MG tablet Take 250 mg by mouth daily.    Marland Kitchen testosterone cypionate (DEPOTESTOSTERONE CYPIONATE) 200 MG/ML injection Inject 200 mg into the muscle every 14 (fourteen) days.    . Tiotropium Bromide-Olodaterol (STIOLTO RESPIMAT) 2.5-2.5 MCG/ACT AERS Inhale 2 puffs into the lungs daily. 4 g 0  . triamcinolone cream (KENALOG) 0.1 % SMARTSIG:1 Topical 3  Times Daily    . triamcinolone ointment (KENALOG) 0.5 % Apply topically 2 (two) times daily.    Grant Ruts INHUB 250-50 MCG/DOSE AEPB Inhale 1 puff into the lungs 2 (two) times daily.     No current facility-administered medications for this visit.    Allergies  Allergen Reactions  . Oxymorphone Other (See Comments)    Flu like symptoms, oral blisters; Opana    Social History   Socioeconomic History  . Marital status: Married    Spouse name: Not on file  . Number of children: Not on file  . Years of education: Not on file  . Highest education level: Not on file  Occupational History  . Not on file  Tobacco Use  . Smoking status: Current Every Day Smoker    Packs/day: 1.00    Years: 45.00    Pack years: 45.00    Types: Cigarettes  . Smokeless tobacco: Never Used  . Tobacco comment: 20-30 cigarettes daily 09/03/19 ARJ   Vaping Use  . Vaping Use: Former  Substance and Sexual Activity  . Alcohol use: Not Currently  . Drug use: Never  . Sexual activity: Not on file  Other Topics Concern  . Not on file  Social History Narrative  . Not on file   Social Determinants of Health   Financial Resource Strain: Not on file  Food Insecurity: Not on file  Transportation Needs: Not on file  Physical Activity: Not on file  Stress: Not on file  Social Connections: Not on file  Intimate Partner Violence: Not on file     ROS- All systems are reviewed and negative except as per the HPI above.  Physical Exam: There were no vitals filed for this visit.  GEN- The patient is a well appearing *** {Desc; male/male:11659}, alert and oriented x 3 today.   HEENT-head normocephalic, atraumatic, sclera clear, conjunctiva pink, hearing intact, trachea midline. Lungs- Clear to ausculation bilaterally, normal work of breathing Heart- ***Regular rate and rhythm, no murmurs, rubs or gallops  GI- soft, NT, ND, + BS Extremities- no clubbing, cyanosis, or edema MS- no significant deformity or  atrophy Skin- no rash or lesion Psych- euthymic mood, full affect Neuro- strength and sensation are intact   Wt Readings from Last 3 Encounters:  11/11/19 113.6 kg  09/03/19 125.7 kg  08/16/19 126.6 kg    EKG today demonstrates  ***  Echo 05/09/19 demonstrated  1. Poor acoustic windows limit study.  2. Left ventricular ejection fraction, by estimation, is 55 to 60%. The  left ventricle has normal function. The left ventricle has no regional  wall motion abnormalities. There is moderate left ventricular hypertrophy.  Left ventricular diastolic parameters are indeterminate.  3. Right ventricular systolic function is normal. The right ventricular  size is normal.  4. The mitral valve is abnormal. No evidence of mitral valve  regurgitation.  5. The aortic valve is normal in structure. Aortic valve regurgitation is  not visualized.  6. The inferior vena cava is dilated in size with <50% respiratory  variability, suggesting right atrial pressure of 15 mmHg.   LA volume 115  Epic records are reviewed at length today  CHA2DS2-VASc Score = 3 The patient's score is based upon: CHF History: Yes HTN History: Yes Age : < 65 Diabetes History: Yes Stroke History: No Vascular Disease History: No Gender: Male   ASSESSMENT AND PLAN: 1. Persistent Atrial Fibrillation (ICD10:  I48.0) The patient's CHA2DS2-VASc score is 3, indicating a 3.2% annual risk of stroke.   S/p dofetilide loading 4/20-4/23/21. *** Continue dofetilide 500 mcg BID. QT stable. Check bmet/mag today. Continue Xarelto 20 mg daily  2. Secondary Hypercoagulable State (ICD10:  D68.69) The patient is at significant risk for stroke/thromboembolism based upon his CHA2DS2-VASc Score of 3.  Continue Rivaroxaban (Xarelto).   3. Obesity There is no height or weight on file to calculate BMI. Lifestyle modification was discussed and encouraged including regular physical activity and weight reduction. ***  4.  Chronic Diastolic CHF No signs or symptoms of fluid overload. ***  5. HTN Stable, no changes today. ***   Follow up with Dr Curt Bears in 6 months. AF clinic in one year. ***   Adline Peals PA-C [facility] [address] [place], [place] [address] [phone] 05/10/2020 3:41 PM

## 2020-05-11 ENCOUNTER — Ambulatory Visit (HOSPITAL_COMMUNITY): Payer: Medicare Other | Admitting: Physician Assistant

## 2020-06-06 ENCOUNTER — Telehealth: Payer: Self-pay | Admitting: Cardiology

## 2020-06-06 NOTE — Telephone Encounter (Signed)
*  STAT* If patient is at the pharmacy, call can be transferred to refill team.   1. Which medications need to be refilled? (please list name of each medication and dose if known) dofetilide (TIKOSYN) 500 MCG capsule  2. Which pharmacy/location (including street and city if local pharmacy) is medication to be sent to? [facility] [place], [place]  3. Do they need a 30 day or 90 day supply? 90 day   Patient has only 3 days left and it is not enough to make it to his appointment 06/12/2020. He states it was prescribed by the afib clinic, but he was told not to go back to them.

## 2020-06-08 ENCOUNTER — Other Ambulatory Visit: Payer: Self-pay

## 2020-06-08 DIAGNOSIS — M549 Dorsalgia, unspecified: Secondary | ICD-10-CM | POA: Insufficient documentation

## 2020-06-08 DIAGNOSIS — M1990 Unspecified osteoarthritis, unspecified site: Secondary | ICD-10-CM | POA: Insufficient documentation

## 2020-06-08 DIAGNOSIS — I4891 Unspecified atrial fibrillation: Secondary | ICD-10-CM | POA: Insufficient documentation

## 2020-06-12 ENCOUNTER — Other Ambulatory Visit: Payer: Self-pay

## 2020-06-12 ENCOUNTER — Ambulatory Visit: Payer: Medicare Other | Admitting: Cardiology

## 2020-06-12 ENCOUNTER — Other Ambulatory Visit (HOSPITAL_COMMUNITY): Payer: Self-pay | Admitting: *Deleted

## 2020-06-12 ENCOUNTER — Encounter: Payer: Self-pay | Admitting: Cardiology

## 2020-06-12 VITALS — BP 116/72 | HR 61 | Ht 72.0 in | Wt 224.0 lb

## 2020-06-12 DIAGNOSIS — E785 Hyperlipidemia, unspecified: Secondary | ICD-10-CM | POA: Diagnosis not present

## 2020-06-12 DIAGNOSIS — I480 Paroxysmal atrial fibrillation: Secondary | ICD-10-CM

## 2020-06-12 DIAGNOSIS — F17200 Nicotine dependence, unspecified, uncomplicated: Secondary | ICD-10-CM | POA: Diagnosis not present

## 2020-06-12 MED ORDER — DOFETILIDE 500 MCG PO CAPS: ORAL_CAPSULE | ORAL | 0 refills | Status: DC

## 2020-06-12 NOTE — Progress Notes (Signed)
Cardiology Office Note:    Date:  06/12/2020   ID:  [name] Sanford, DOB 06-Dec-1955, MRN [id number]  PCP:  Dustin Flock, PA-C  Cardiologist:  Jenne Campus, MD    Referring MD: Dustin Flock, [place]   Chief Complaint  Patient presents with  . Follow-up  Paroxysmal atrial fibrillation and doing well  History of Present Illness:    [name] Sanford is a 65 y.o. male with past medical history significant for essential hypertension, diabetes, paroxysmal atrial fibrillation, anticoagulated with Xarelto and suppressed with Tikosyn, smoking, obesity.  He was initially diagnosed with atrial fibrillation March 2021 after presented to the emergency room symptoms of shortness of breath he was fine acutely fluid for overloaded with preserved left ventricle ejection fraction he was initiated with Xarelto for his CHADS2 Vascor equals 3 and then he spontaneously converted to sinus rhythm.  Then he was loaded with dofetilide which was done in April 2021 and she seems to maintaining sinus rhythm since then. He comes today 2 months of follow-up overall he is doing great.  Denies have any palpitations, no tightness squeezing pressure burning chest.  He does have chronic cough as well as some shortness of breath which is related to COPD.  Denies have any dizziness or passing out.  Overall tolerates Tikosyn quite well.  Past Medical History:  Diagnosis Date  . Abscess of right groin 08/25/2018  . Acute exacerbation of CHF (congestive heart failure) ([place]) 05/09/2019  . Arthritis   . Arthropathy of lumbar facet joint 01/17/2020  . Atrial fibrillation ([place])   . Atrial fibrillation ([place])   . Atrial flutter ([place]) 05/18/2019  . Back pain   . Bilateral thoracic back pain 12/10/2013  . Chronic back pain 12/10/2013  . COPD (chronic obstructive pulmonary disease) ([place])   . Hematuria, gross 06/19/2016  . Hx of lumbosacral spine surgery 12/31/2016  . Low back pain 09/13/2014  . Malfunction of spinal cord stimulator ([place])  04/01/2017  . Mechanical complication nervous system device/implant/graft 12/10/2013  . Nocturnal hypoxemia 09/03/2019  . Pain in right knee 05/13/2019  . Paroxysmal atrial fibrillation ([place]) 05/18/2019  . Persistent atrial fibrillation ([place]) 06/08/2019  . Post laminectomy syndrome 04/01/2017  . Precordial pain 07/19/2016  . Recurrent UTI 06/04/2016  . Right inguinal pain 08/25/2018  . Secondary hypercoagulable state ([place]) 05/18/2019  . Smoking 07/19/2016  . Status post lumbar spinal fusion 09/13/2014  . Visit for monitoring Tikosyn therapy 06/08/2019    Past Surgical History:  Procedure Laterality Date  . BRAIN SURGERY    . NECK SURGERY    . SHOULDER SURGERY    . SPINAL CORD STIMULATOR IMPLANT      Current Medications: Current Meds  Medication Sig  . albuterol (PROVENTIL) (2.5 MG/3ML) 0.083% nebulizer solution Inhale 3 mLs into the lungs every 4 (four) hours as needed for shortness of breath.  Marland Kitchen atorvastatin (LIPITOR) 10 MG tablet Take 10 mg by mouth daily.  Marland Kitchen dofetilide (TIKOSYN) 500 MCG capsule TAKE 1 CAPSULE(500 MCG) BY MOUTH TWICE DAILY (Patient taking differently: Take 500 mcg by mouth 2 (two) times daily. TAKE 1 CAPSULE(500 MCG) BY MOUTH TWICE DAILY)  . furosemide (LASIX) 40 MG tablet Take 71m in the AM and 458min the PM (Patient taking differently: Take 40 mg by mouth 2 (two) times daily. Take 6038mn the AM and 63m3m the PM)  . HYDROmorphone HCl ER 16 MG TB24 Take 16 mg by mouth daily.   . LIMarland KitchenZESS 290 MCG CAPS capsule Take 290 mcg  by mouth daily before breakfast.   . metFORMIN (GLUCOPHAGE-XR) 500 MG 24 hr tablet Take 500 mg by mouth daily.  . mometasone (ELOCON) 0.1 % cream Apply 1 application topically as needed (rosacea).   Marland Kitchen oxycodone (ROXICODONE) 30 MG immediate release tablet Take 30 mg by mouth every 4 (four) hours as needed for pain.  . rivaroxaban (XARELTO) 20 MG TABS tablet Take 1 tablet (20 mg total) by mouth daily with supper.  . terbinafine (LAMISIL) 250 MG tablet Take  250 mg by mouth daily.  Marland Kitchen testosterone cypionate (DEPOTESTOSTERONE CYPIONATE) 200 MG/ML injection Inject 200 mg into the muscle every 14 (fourteen) days.  . Tiotropium Bromide-Olodaterol (STIOLTO RESPIMAT) 2.5-2.5 MCG/ACT AERS Inhale 2 puffs into the lungs daily.     Allergies:   Oxymorphone and Oxymorphone hcl   Social History   Socioeconomic History  . Marital status: Married    Spouse name: Not on file  . Number of children: Not on file  . Years of education: Not on file  . Highest education level: Not on file  Occupational History  . Not on file  Tobacco Use  . Smoking status: Current Every Day Smoker    Packs/day: 1.00    Years: 45.00    Pack years: 45.00    Types: Cigarettes  . Smokeless tobacco: Never Used  . Tobacco comment: 20-30 cigarettes daily 09/03/19 ARJ   Vaping Use  . Vaping Use: Former  Substance and Sexual Activity  . Alcohol use: Not Currently  . Drug use: Never  . Sexual activity: Not on file  Other Topics Concern  . Not on file  Social History Narrative  . Not on file   Social Determinants of Health   Financial Resource Strain: Not on file  Food Insecurity: Not on file  Transportation Needs: Not on file  Physical Activity: Not on file  Stress: Not on file  Social Connections: Not on file     Family History: The patient's family history includes Cancer in his father and mother; Heart disease in his father. ROS:   Please see the history of present illness.    All 14 point review of systems negative except as described per history of present illness  EKGs/Labs/Other Studies Reviewed:      Recent Labs: 08/29/2019: ALT 17; Hemoglobin 14.7; Platelets 225 11/11/2019: BUN 11; Creatinine, Ser 1.13; Magnesium 2.2; Potassium 4.8; Sodium 136  Recent Lipid Panel    Component Value Date/Time   CHOL 191 05/09/2019 1537   TRIG 63 05/09/2019 1537   HDL 47 05/09/2019 1537   CHOLHDL 4.1 05/09/2019 1537   VLDL 13 05/09/2019 1537   LDLCALC 131 (H)  05/09/2019 1537    Physical Exam:    VS:  BP 116/72 (BP Location: Left Arm, Patient Position: Sitting)   Pulse 61   Ht 6' (1.829 m)   Wt 224 lb (101.6 kg)   SpO2 98%   BMI 30.38 kg/m     Wt Readings from Last 3 Encounters:  06/12/20 224 lb (101.6 kg)  11/11/19 250 lb 6.4 oz (113.6 kg)  09/03/19 277 lb 3.2 oz (125.7 kg)     GEN:  Well nourished, well developed in no acute distress HEENT: Normal NECK: No JVD; No carotid bruits LYMPHATICS: No lymphadenopathy CARDIAC: RRR, no murmurs, no rubs, no gallops RESPIRATORY:  Clear to auscultation without rales, wheezing or rhonchi  ABDOMEN: Soft, non-tender, non-distended MUSCULOSKELETAL:  No edema; No deformity  SKIN: Warm and dry LOWER EXTREMITIES: no swelling NEUROLOGIC:  Alert and oriented x 3 PSYCHIATRIC:  Normal affect   ASSESSMENT:    1. Paroxysmal atrial fibrillation (HCC)   2. Smoking   3. Dyslipidemia    PLAN:    In order of problems listed above:  1. Paroxysmal atrial fibrillation, maintained sinus rhythm, denies have any dizziness or passing out.  No palpitations, EKG revealed QT corrected 422.  Skis kidney function at last check was in February of this year I do have data from his primary care physician showing his GFR of 79 with creatinine 1.01.  We will continue present management. 2. Smoking obviously huge problem we spent a great deal of time talking about this I strongly recommend to quit he understand he is working on it.  On top of that his wife is after him as well so hopefully with all this pressure he will be able to quit. 3. Dyslipidemia, he is taking Lipitor 10 mg which I will continue I did review his K data from primary care physician which show me his LDL of 67.  Used to be 134 without medication at an excellent cholesterol profile we will continue present medication.  His HDL was 46 and this is data from February 2022. 4. Thyroid test were normal.  Overall lab work test reviewed were normal including  liver function test.  He does have secondary polycythemia which is undoubtedly related to smoking. 5. We did talk a lot about healthy lifestyle he understand that the biggest problem is smoking and is trying to work on quitting.  He lost about 60 pounds with proper dieting which significantly improve his outcome and outlook.  Chances for him to have atrial fibrillation a lesser as well as after weight loss.  Sadly because of chronic back problem he have difficulty exercising. 6. Overall he is doing quite well we will continue present management see him back in 6 months   Medication Adjustments/Labs and Tests Ordered: Current medicines are reviewed at length with the patient today.  Concerns regarding medicines are outlined above.  No orders of the defined types were placed in this encounter.  Medication changes: No orders of the defined types were placed in this encounter.   Signed, Park Liter, MD, [facility] 06/12/2020 9:04 AM    [place]

## 2020-06-12 NOTE — Patient Instructions (Signed)
Medication Instructions:  Your physician recommends that you continue on your current medications as directed. Please refer to the Current Medication list given to you today.  *If you need a refill on your cardiac medications before your next appointment, please call your pharmacy*   Lab Work: None If you have labs (blood work) drawn today and your tests are completely normal, you will receive your results only by: Marland Kitchen MyChart Message (if you have MyChart) OR . A paper copy in the mail If you have any lab test that is abnormal or we need to change your treatment, we will call you to review the results.   Testing/Procedures: None   Follow-Up: At [facility], you and your health needs are our priority.  As part of our continuing mission to provide you with exceptional heart care, we have created designated Provider Care Teams.  These Care Teams include your primary Cardiologist (physician) and Advanced Practice Providers (APPs -  Physician Assistants and Nurse Practitioners) who all work together to provide you with the care you need, when you need it.  We recommend signing up for the patient portal called "MyChart".  Sign up information is provided on this After Visit Summary.  MyChart is used to connect with patients for Virtual Visits (Telemedicine).  Patients are able to view lab/test results, encounter notes, upcoming appointments, etc.  Non-urgent messages can be sent to your provider as well.   To learn more about what you can do with MyChart, go to [email or web address].    Your next appointment:   6 month(s)  The format for your next appointment:   In Person  Provider:   Jenne Campus, MD   Other Instructions

## 2020-06-12 NOTE — Telephone Encounter (Signed)
Prescription sent. Will need appt to continue refills through AF Clinic

## 2020-06-28 DIAGNOSIS — M5137 Other intervertebral disc degeneration, lumbosacral region: Secondary | ICD-10-CM | POA: Insufficient documentation

## 2020-06-28 DIAGNOSIS — Z9689 Presence of other specified functional implants: Secondary | ICD-10-CM | POA: Insufficient documentation

## 2020-07-20 ENCOUNTER — Ambulatory Visit (INDEPENDENT_AMBULATORY_CARE_PROVIDER_SITE_OTHER): Payer: Medicare Other | Admitting: Gastroenterology

## 2020-07-20 ENCOUNTER — Telehealth: Payer: Self-pay

## 2020-07-20 ENCOUNTER — Other Ambulatory Visit: Payer: Self-pay

## 2020-07-20 ENCOUNTER — Encounter: Payer: Self-pay | Admitting: Gastroenterology

## 2020-07-20 VITALS — BP 122/68 | HR 60 | Ht 72.0 in | Wt 222.0 lb

## 2020-07-20 DIAGNOSIS — R69 Illness, unspecified: Secondary | ICD-10-CM

## 2020-07-20 DIAGNOSIS — Z86010 Personal history of colonic polyps: Secondary | ICD-10-CM | POA: Diagnosis not present

## 2020-07-20 DIAGNOSIS — K625 Hemorrhage of anus and rectum: Secondary | ICD-10-CM | POA: Diagnosis not present

## 2020-07-20 DIAGNOSIS — T402X5A Adverse effect of other opioids, initial encounter: Secondary | ICD-10-CM

## 2020-07-20 DIAGNOSIS — K5903 Drug induced constipation: Secondary | ICD-10-CM

## 2020-07-20 MED ORDER — CLENPIQ 10-3.5-12 MG-GM -GM/160ML PO SOLN: 1.0000 | Freq: Once | ORAL | 0 refills | Status: AC

## 2020-07-20 NOTE — Telephone Encounter (Signed)
Patient with diagnosis of afib on Xarelto for anticoagulation.    Procedure: colonoscopy Date of procedure: 08/17/20  CHA2DS2-VASc Score = 3  This indicates a 3.2% annual risk of stroke. The patient's score is based upon: CHF History: Yes HTN History: Yes Diabetes History: Yes Stroke History: No Vascular Disease History: No Age Score: 0 Gender Score: 0     CrCl 74.4 ml/min Platelet count 225  Per office protocol, patient can hold Xarelto for 1 day prior to procedure.

## 2020-07-20 NOTE — Progress Notes (Signed)
Chief Complaint: for colon  Referring Provider:  Dustin Flock, PA-C      ASSESSMENT AND PLAN;   #1. Rectal bleeding. Stable Hb.  Likely d/t internal hemorrhoids.  R/o other causes.  #2. OIC (opioid induced constipation)  #3. H/O polyps  #4. Multiple comorbid conditions including A. fib on Xarelto, COPD with continued smoking, DM2, chronic back pain on narcotics.  Plan: -Colon 2 day prep, after clearence from cardio, and hold Xeralto x 24hrs before -Add Miralax 17g po qd -Linzess 241mg po qd (on it)   Discussed risks & benefit of colonoscopy. Risks including rare perforation req laparotomy, bleeding after bx/polypectomy req blood transfusion, rarely missing neoplasms, risks of anesthesia/sedation, rare risk of damage to internal organs. Benefits outweigh the risks. Patient agrees to proceed. All the questions were answered. Pt consents to proceed.  Understands that he is at a higher than baseline risk.  HPI:    [name] Sanford a 65y.o. male  A Fib on Xeralto, COPD, H/O CHF (resolved), EF 66-80% (04/2019), DM2, chronic back pain on narcotics  C/O chronic constipation with occ rectal bleeding-mostly bright red in color x 5 to 6 years.  No weight loss. Hb has been stable in 14-15 range, despite Xarelto.  He has been advised to get colonoscopy.  He has longstanding history of chronic constipation and would have BMs 1/week.  Has taken MiraLAX in the past.  Most recently started on Linzess to 90 mcg p.o. once a day with resultant bowel movements and frequency of 2-3 times per week.  He has been on Movantik in the past which did not work.  He also takes senna on as-needed basis.  Denies having any upper GI symptoms including nausea, heartburn, regurgitation, odynophagia or dysphagia.  No family history of colon cancer or colonic polyps.   Past GI procedures Colonoscopy 10/2016 ([facility]): Fair preparation.  90% of the colonic mucosa was visualized.  Colonic polyps s/p polypectomy,  internal hemorrhoids.  Mild sigmoid diverticulosis, melanosis coli. Bx-tubular adenomas.  Advised to repeat in 1 year with 2-day prep.  He did get letters.   Past Medical History:  Diagnosis Date  . Abscess of right groin 08/25/2018  . Acute exacerbation of CHF (congestive heart failure) ([facility] 05/09/2019  . Anxiety and depression   . Arthritis   . Arthropathy of lumbar facet joint 01/17/2020  . Atrial fibrillation ([facility]   . Atrial fibrillation ([facility]   . Atrial flutter ([facility] 05/18/2019  . Back pain   . Bilateral thoracic back pain 12/10/2013  . Chronic back pain 12/10/2013  . COPD (chronic obstructive pulmonary disease) ([facility]   . Hematuria, gross 06/19/2016  . History of colon polyps   . Hx of lumbosacral spine surgery 12/31/2016  . Low back pain 09/13/2014  . Malfunction of spinal cord stimulator ([facility] 04/01/2017  . Mechanical complication nervous system device/implant/graft 12/10/2013  . Nocturnal hypoxemia 09/03/2019  . Pain in right knee 05/13/2019  . Paroxysmal atrial fibrillation ([facility] 05/18/2019  . Persistent atrial fibrillation ([facility] 06/08/2019  . Post laminectomy syndrome 04/01/2017  . Precordial pain 07/19/2016  . Recurrent UTI 06/04/2016  . Right inguinal pain 08/25/2018  . Secondary hypercoagulable state ([facility] 05/18/2019  . Smoking 07/19/2016  . Status post lumbar spinal fusion 09/13/2014  . Visit for monitoring Tikosyn therapy 06/08/2019    Past Surgical History:  Procedure Laterality Date  . BRAIN SURGERY    . COLONOSCOPY  10/28/2016   Colonic polyps status post polypectomy. Internal hemorrhoids (likely etiology  of rectal bleeding-no active bleeding currently)  . NECK SURGERY    . SHOULDER SURGERY    . SPINAL CORD STIMULATOR IMPLANT    . WRIST SURGERY      Family History  Problem Relation Age of Onset  . Cancer Mother        Lung  . Heart disease Father   . Cancer Father   . Ovarian cancer Sister   . Heart disease Brother   . Colon cancer Neg Hx   . Esophageal cancer  Neg Hx   . Stomach cancer Neg Hx     Social History   Tobacco Use  . Smoking status: Current Every Day Smoker    Packs/day: 1.00    Years: 45.00    Pack years: 45.00    Types: Cigarettes  . Smokeless tobacco: Never Used  . Tobacco comment: 20-30 cigarettes daily 09/03/19 [facility]   Vaping Use  . Vaping Use: Former  Substance Use Topics  . Alcohol use: Not Currently  . Drug use: Never    Current Outpatient Medications  Medication Sig Dispense Refill  . albuterol (PROVENTIL) (2.5 MG/3ML) 0.083% nebulizer solution Inhale 3 mLs into the lungs every 4 (four) hours as needed for shortness of breath.    Marland Kitchen atorvastatin (LIPITOR) 10 MG tablet Take 10 mg by mouth daily.    Marland Kitchen dofetilide (TIKOSYN) 500 MCG capsule TAKE 1 CAPSULE(500 MCG) BY MOUTH TWICE DAILY appt required for refill [phone] 60 capsule 0  . furosemide (LASIX) 40 MG tablet Take 68m in the AM and 478min the PM (Patient taking differently: Take 40 mg by mouth 2 (two) times daily. Take 6052mn the AM and 65m64m the PM) 75 tablet 6  . HYDROmorphone HCl ER 16 MG TB24 Take 16 mg by mouth daily.     . LIMarland KitchenZESS 290 MCG CAPS capsule Take 290 mcg by mouth daily before breakfast.     . metFORMIN (GLUCOPHAGE-XR) 500 MG 24 hr tablet Take 500 mg by mouth daily.    . mometasone (ELOCON) 0.1 % cream Apply 1 application topically as needed (rosacea).     . oxMarland Kitchencodone (ROXICODONE) 30 MG immediate release tablet Take 30 mg by mouth every 4 (four) hours as needed for pain.    . rivaroxaban (XARELTO) 20 MG TABS tablet Take 1 tablet (20 mg total) by mouth daily with supper. 30 tablet 11  . terbinafine (LAMISIL) 250 MG tablet Take 250 mg by mouth daily.    . teMarland Kitchentosterone cypionate (DEPOTESTOSTERONE CYPIONATE) 200 MG/ML injection Inject 200 mg into the muscle every 14 (fourteen) days.     No current facility-administered medications for this visit.    Allergies  Allergen Reactions  . Oxymorphone Other (See Comments)    Flu like symptoms, oral  blisters; Opana  . Oxymorphone Hcl     Review of Systems:  Constitutional: Denies fever, chills, diaphoresis, appetite change and has fatigue.  HEENT: Denies photophobia, eye pain, redness, hearing loss, ear pain, congestion, sore throat, rhinorrhea, sneezing, mouth sores, has neck pain, neck stiffness. No  tinnitus.   Respiratory: Denies SOB, DOE, cough, chest tightness,  and occ wheezing.   Cardiovascular: Denies chest pain, palpitations and leg swelling.  Genitourinary: Denies dysuria, urgency, frequency, hematuria, flank pain and difficulty urinating.  Musculoskeletal: Denies myalgias, has back pain, no joint swelling, has arthralgias and has gait problem.  Skin: No rash.  Neurological: Denies dizziness, seizures, syncope, weakness, light-headedness, has numbness and headaches.  Hematological: Denies adenopathy. Easy bruising, personal  or family bleeding history  Psychiatric/Behavioral: has anxiety or depression     Physical Exam:    BP 122/68   Pulse 60   Ht 6' (1.829 m)   Wt 222 lb (100.7 kg)   SpO2 95%   BMI 30.11 kg/m  Wt Readings from Last 3 Encounters:  07/20/20 222 lb (100.7 kg)  06/12/20 224 lb (101.6 kg)  11/11/19 250 lb 6.4 oz (113.6 kg)   Constitutional:  Well-developed, in no acute distress. Psychiatric: Normal mood and affect. Behavior is normal. HEENT: Pupils normal.  Conjunctivae are normal. No scleral icterus. Cardiovascular: Normal rate, regular rhythm. No edema Pulmonary/chest: Effort normal and breath sounds-decreased. No wheezing, rales or rhonchi. Abdominal: Soft, nondistended. Nontender. Bowel sounds active throughout. There are no masses palpable. No hepatomegaly. Rectal: Deferred Neurological: Alert and oriented to person place and time. Skin: Skin is warm and dry. No rashes noted.  Data Reviewed: I have personally reviewed following labs and imaging studies  CBC: CBC Latest Ref Rng & Units 08/29/2019 05/10/2019 05/09/2019  WBC 4.0 - 10.5 K/uL  8.0 12.6(H) 7.5  Hemoglobin 13.0 - 17.0 g/dL 14.7 14.3 14.8  Hematocrit 39.0 - 52.0 % 47.3 45.3 47.0  Platelets 150 - 400 K/uL 225 264 216    CMP: CMP Latest Ref Rng & Units 11/11/2019 08/29/2019 07/13/2019  Glucose 70 - 99 mg/dL 95 131(H) 98  BUN 8 - 23 mg/dL _0 Creatinine 0.61 - 1.24 mg/dL 1.13 1.19 1.22  Sodium 135 - 145 mmol/L 136 138 139  Potassium 3.5 - 5.1 mmol/L 4.8 4.3 4.4  Chloride 98 - 111 mmol/L 99 97(L) 96(L)  CO2 22 - 32 mmol/L 29 34(H) 31  Calcium 8.9 - 10.3 mg/dL 9.1 9.0 9.5  Total Protein 6.5 - 8.1 g/dL - 7.3 -  Total Bilirubin 0.3 - 1.2 mg/dL - 0.1(L) -  Alkaline Phos 38 - 126 U/L - 75 -  AST 15 - 41 U/L - 18 -  ALT 0 - 44 U/L - 17 -       Carmell Austria, MD 07/20/2020, 9:16 AM  Cc: Dustin Flock, PA-C

## 2020-07-20 NOTE — Telephone Encounter (Signed)
[facility] Pre-operative Risk Assessment     Request for surgical clearance:     Endoscopy Procedure  What type of surgery is being performed?     08/17/2020  When is this surgery scheduled?     Colonoscopy  What type of clearance is required ?   Pharmacy  Are there any medications that need to be held prior to surgery and how long? Xarelto 24 hour hold  Practice name and name of physician performing surgery?      [facility]  What is your office phone and fax number?      Phone- [phone]  [phone]  Anesthesia type (None, local, MAC, general) ?       MAC

## 2020-07-20 NOTE — Telephone Encounter (Signed)
[name] Sanford DOB:  [birth year]  MRN:  [id number]   Primary Cardiologist: None  Chart reviewed as part of pre-operative protocol coverage.Pharmacy clearance only. Mr.  [name] Sanford  May hold Xarelto 1 day prior to planned procedure.  I will route this recommendation to the requesting party via Epic fax function and remove from pre-op pool.  Please call with questions.  Loel Dubonnet, NP 07/20/2020, 12:34 PM

## 2020-07-20 NOTE — Patient Instructions (Addendum)
If you are age 63 or older, your body mass index should be between 23-30. Your Body mass index is 30.11 kg/m. If this is out of the aforementioned range listed, please consider follow up with your Primary Care Provider.  If you are age 56 or younger, your body mass index should be between 19-25. Your Body mass index is 30.11 kg/m. If this is out of the aformentioned range listed, please consider follow up with your Primary Care Provider.   __________________________________________________________  The [facility] providers would like to encourage you to use [facility] to communicate with providers for non-urgent requests or questions.  Due to long hold times on the telephone, sending your provider a message by [facility] may be a faster and more efficient way to get a response.  Please allow 48 business hours for a response.  Please remember that this is for non-urgent requests.   Continue with Miralax and Linzess  You have been scheduled for a colonoscopy. Please follow written instructions given to you at your visit today.  Please pick up your prep supplies at the pharmacy within the next 1-3 days. If you use inhalers (even only as needed), please bring them with you on the day of your procedure.  You will be contacted by our office prior to your procedure for directions on holding your Xarelto.  If you do not hear from our office 1 week prior to your scheduled procedure, please call [phone] to discuss.  Thank you,  Dr. Jackquline Denmark

## 2020-07-20 NOTE — Telephone Encounter (Signed)
Patient made aware. Thank you!

## 2020-08-15 ENCOUNTER — Encounter: Payer: Self-pay | Admitting: Gastroenterology

## 2020-08-17 ENCOUNTER — Encounter: Payer: Medicare Other | Admitting: Gastroenterology

## 2020-08-23 ENCOUNTER — Telehealth: Payer: Self-pay | Admitting: Gastroenterology

## 2020-08-23 NOTE — Telephone Encounter (Signed)
Went over instructions and shew as told to call back with any more questions but she said she had everything for him

## 2020-08-23 NOTE — Telephone Encounter (Signed)
Pls call pt's wife Levada Dy. She would like to go over all instructions for her husband's procedure in detail.

## 2020-08-23 NOTE — Telephone Encounter (Signed)
Spoke with husband and he said wife had paperwork so called wife and left voicemail since she didn't answer

## 2020-08-29 ENCOUNTER — Other Ambulatory Visit: Payer: Self-pay

## 2020-08-29 ENCOUNTER — Encounter: Payer: Self-pay | Admitting: Gastroenterology

## 2020-08-29 ENCOUNTER — Ambulatory Visit (AMBULATORY_SURGERY_CENTER): Payer: Medicare Other | Admitting: Gastroenterology

## 2020-08-29 VITALS — BP 145/67 | HR 50 | Temp 98.4°F | Resp 15 | Ht 72.0 in | Wt 222.0 lb

## 2020-08-29 DIAGNOSIS — K625 Hemorrhage of anus and rectum: Secondary | ICD-10-CM | POA: Diagnosis not present

## 2020-08-29 DIAGNOSIS — Z86010 Personal history of colonic polyps: Secondary | ICD-10-CM

## 2020-08-29 DIAGNOSIS — K648 Other hemorrhoids: Secondary | ICD-10-CM

## 2020-08-29 DIAGNOSIS — D123 Benign neoplasm of transverse colon: Secondary | ICD-10-CM | POA: Diagnosis not present

## 2020-08-29 DIAGNOSIS — D125 Benign neoplasm of sigmoid colon: Secondary | ICD-10-CM

## 2020-08-29 DIAGNOSIS — D124 Benign neoplasm of descending colon: Secondary | ICD-10-CM

## 2020-08-29 DIAGNOSIS — K5730 Diverticulosis of large intestine without perforation or abscess without bleeding: Secondary | ICD-10-CM

## 2020-08-29 MED ORDER — SODIUM CHLORIDE 0.9 % IV SOLN: 500.0000 mL | Freq: Once | INTRAVENOUS | Status: DC

## 2020-08-29 MED ORDER — DEXTROSE 50 % IV SOLN
1.0000 | Freq: Once | INTRAVENOUS | Status: AC
  Administered 2020-08-29: 50 mL via INTRAVENOUS

## 2020-08-29 NOTE — Progress Notes (Signed)
Called to room to assist during endoscopic procedure.  Patient ID and intended procedure confirmed with present staff. Received instructions for my participation in the procedure from the performing physician.

## 2020-08-29 NOTE — Patient Instructions (Addendum)
Please read handouts provided. Continue present medications. Await pathology results. Preparation H as needed. Resume Xarelto tomorrow. Continue MiraLAX 17 g once a day and Linzess 290 mcg everyday.   YOU HAD AN ENDOSCOPIC PROCEDURE TODAY AT [facility]:   Refer to the procedure report that was given to you for any specific questions about what was found during the examination.  If the procedure report does not answer your questions, please call your gastroenterologist to clarify.  If you requested that your care partner not be given the details of your procedure findings, then the procedure report has been included in a sealed envelope for you to review at your convenience later.  YOU SHOULD EXPECT: Some feelings of bloating in the abdomen. Passage of more gas than usual.  Walking can help get rid of the air that was put into your GI tract during the procedure and reduce the bloating. If you had a lower endoscopy (such as a colonoscopy or flexible sigmoidoscopy) you may notice spotting of blood in your stool or on the toilet paper. If you underwent a bowel prep for your procedure, you may not have a normal bowel movement for a few days.  Please Note:  You might notice some irritation and congestion in your nose or some drainage.  This is from the oxygen used during your procedure.  There is no need for concern and it should clear up in a day or so.  SYMPTOMS TO REPORT IMMEDIATELY:  Following lower endoscopy (colonoscopy or flexible sigmoidoscopy):  Excessive amounts of blood in the stool  Significant tenderness or worsening of abdominal pains  Swelling of the abdomen that is new, acute  Fever of 100F or higher   For urgent or emergent issues, a gastroenterologist can be reached at any hour by calling [phone]. Do not use MyChart messaging for urgent concerns.    DIET:  We do recommend a small meal at first, but then you may proceed to your regular diet.  Drink  plenty of fluids but you should avoid alcoholic beverages for 24 hours.  ACTIVITY:  You should plan to take it easy for the rest of today and you should NOT DRIVE or use heavy machinery until tomorrow (because of the sedation medicines used during the test).    FOLLOW UP: Our staff will call the number listed on your records 48-72 hours following your procedure to check on you and address any questions or concerns that you may have regarding the information given to you following your procedure. If we do not reach you, we will leave a message.  We will attempt to reach you two times.  During this call, we will ask if you have developed any symptoms of COVID 19. If you develop any symptoms (ie: fever, flu-like symptoms, shortness of breath, cough etc.) before then, please call [phone].  If you test positive for Covid 19 in the 2 weeks post procedure, please call and report this information to [place].    If any biopsies were taken you will be contacted by phone or by letter within the next 1-3 weeks.  Please call us at [phone] if you have not heard about the biopsies in 3 weeks.    SIGNATURES/CONFIDENTIALITY: You and/or your care partner have signed paperwork which will be entered into your electronic medical record.  These signatures attest to the fact that that the information above on your After Visit Summary has been reviewed and is understood.  Full responsibility of  the confidentiality of this discharge information lies with you and/or your care-partner.

## 2020-08-29 NOTE — Progress Notes (Signed)
Report to PACU, RN, vss, BBS= Clear.  

## 2020-08-29 NOTE — Progress Notes (Signed)
Medical history reviewed with no changes noted. VS assessed by N.C BG noted 79. Amp D50 administered as ordered.

## 2020-08-29 NOTE — Op Note (Signed)
[facility] Patient Name: [name] Sanford Procedure Date: 08/29/2020 9:35 AM MRN: [id number] Endoscopist: Jackquline Denmark , MD Age: 65 Referring MD:  Date of Birth: Feb 21, 1955 Gender: Male Account #: [id number] Procedure:                Colonoscopy Indications:              Rectal bleeding Medicines:                Monitored Anesthesia Care Procedure:                Pre-Anesthesia Assessment:                           - Prior to the procedure, a History and Physical                            was performed, and patient medications and                            allergies were reviewed. The patient's tolerance of                            previous anesthesia was also reviewed. The risks                            and benefits of the procedure and the sedation                            options and risks were discussed with the patient.                            All questions were answered, and informed consent                            was obtained. Prior Anticoagulants: The patient has                            taken Xarelto (rivaroxaban), last dose was 1 day                            prior to procedure. ASA Grade Assessment: III - A                            patient with severe systemic disease. After                            reviewing the risks and benefits, the patient was                            deemed in satisfactory condition to undergo the                            procedure.  After obtaining informed consent, the colonoscope                            was passed under direct vision. Throughout the                            procedure, the patient's blood pressure, pulse, and                            oxygen saturations were monitored continuously. The                            CF [id number] #[id number] was introduced through the anus                            and advanced to the 2 cm into the ileum. The                             colonoscopy was performed without difficulty. The                            patient tolerated the procedure well. The quality                            of the bowel preparation was good. The terminal                            ileum, ileocecal valve, appendiceal orifice, and                            rectum were photographed. Scope In: 9:40:38 AM Scope Out: 9:58:27 AM Scope Withdrawal Time: 0 hours 14 minutes 25 seconds  Total Procedure Duration: 0 hours 17 minutes 49 seconds  Findings:                 Four sessile polyps were found in the distal                            sigmoid colon(2), proximal descending colon and mid                            transverse colon. The polyps were 4 to 6 mm in                            size. These polyps were removed with a cold snare.                            Resection and retrieval were complete.                           A few small-mouthed diverticula were found in the                            sigmoid colon.  Non-bleeding internal hemorrhoids were found during                            retroflexion. The hemorrhoids were moderate.                           The terminal ileum appeared normal.                           The exam was otherwise without abnormality on                            direct and retroflexion views. Complications:            No immediate complications. Estimated Blood Loss:     Estimated blood loss: none. Impression:               - Four 4 to 6 mm polyps in the distal sigmoid                            colon, in the proximal descending colon and in the                            mid transverse colon, removed with a cold snare.                            Resected and retrieved.                           - Mild sigmoid diverticulosis.                           - Non-bleeding internal hemorrhoids.                           - The examined portion of the ileum was normal.                           -  The examination was otherwise normal on direct                            and retroflexion views. Recommendation:           - Patient has a contact number available for                            emergencies. The signs and symptoms of potential                            delayed complications were discussed with the                            patient. Return to normal activities tomorrow.                            Written discharge instructions were provided to the  patient.                           - Resume previous diet.                           - Continue present medications.                           - Await pathology results.                           - Repeat colonoscopy for surveillance based on                            pathology results.                           - The findings and recommendations were discussed                            with the patient's family.                           - Continue MiraLAX 17 g p.o. once a day and Linzess                            231mg po qd.                           - Preparation H on as needed basis.                           - Resume Xarelto from tomorrow onwards. RJackquline Denmark MD 08/29/2020 10:05:14 AM This report has been signed electronically.

## 2020-08-30 ENCOUNTER — Other Ambulatory Visit: Payer: Self-pay | Admitting: *Deleted

## 2020-08-31 ENCOUNTER — Telehealth: Payer: Self-pay | Admitting: *Deleted

## 2020-08-31 NOTE — Telephone Encounter (Signed)
  Follow up Call-  Call back number 08/29/2020  Post procedure Call Back phone  # [phone]  Permission to leave phone message Yes  Some recent data might be hidden     No answer at 2nd attempt follow up phone call.  Left message on voicemail.

## 2020-08-31 NOTE — Telephone Encounter (Signed)
  Follow up Call-  Call back number 08/29/2020  Post procedure Call Back phone  # [phone]  Permission to leave phone message Yes  Some recent data might be hidden     First attempt for follow up phone call. No answer at number given.  Left message on voicemail.

## 2020-09-02 ENCOUNTER — Encounter: Payer: Self-pay | Admitting: Gastroenterology

## 2020-09-04 ENCOUNTER — Telehealth: Payer: Self-pay | Admitting: Cardiology

## 2020-09-04 NOTE — Telephone Encounter (Signed)
*  STAT* If patient is at the pharmacy, call can be transferred to refill team.   1. Which medications need to be refilled? (please list name of each medication and dose if known)   dofetilide (TIKOSYN) 500 MCG capsule  2. Which pharmacy/location (including street and city if local pharmacy) is medication to be sent to?  [facility] [place], [place]  3. Do they need a 30 day or 90 day supply? 90 days

## 2020-09-06 MED ORDER — DOFETILIDE 500 MCG PO CAPS: ORAL_CAPSULE | ORAL | 1 refills | Status: DC

## 2020-09-06 NOTE — Telephone Encounter (Signed)
     Pt is calling back to follow up, Pt said he is out of medication and needs refill today

## 2020-09-06 NOTE — Telephone Encounter (Signed)
Apologized to pt for the delay in getting back with him. Rx sent to requested pharmacy.  Pt scheduled for follow up with Dr. Curt Bears 8/15. Aware he will need to see cardiology q70mwhile on Tikosyn. Patient verbalized understanding and agreeable to plan.

## 2020-10-02 ENCOUNTER — Ambulatory Visit: Payer: Medicare Other | Admitting: Cardiology

## 2020-12-07 ENCOUNTER — Other Ambulatory Visit: Payer: Self-pay | Admitting: Cardiology

## 2020-12-07 ENCOUNTER — Telehealth: Payer: Self-pay | Admitting: Cardiology

## 2020-12-07 MED ORDER — DOFETILIDE 500 MCG PO CAPS: 500.0000 ug | ORAL_CAPSULE | Freq: Two times a day (BID) | ORAL | 1 refills | Status: DC

## 2020-12-07 NOTE — Telephone Encounter (Signed)
*  STAT* If patient is at the pharmacy, call can be transferred to refill team.   1. Which medications need to be refilled? (please list name of each medication and dose if known)  dofetilide (TIKOSYN) 500 MCG capsule  2. Which pharmacy/location (including street and city if local pharmacy) is medication to be sent to? [facility] [place], [place]  3. Do they need a 30 day or 90 day supply?  90 day supply   Runs out of medication tomorrow

## 2020-12-07 NOTE — Telephone Encounter (Signed)
Pt made aware Rx will be sent to pharmacy. Aware 30 day supply w/ 1 refill sent. Made aware he will need to keep follow up  appt next month in order to receive anymore refills. Educated to importance of [id number] while taking this medication. Patient verbalized understanding and agreeable to plan.

## 2021-01-01 ENCOUNTER — Ambulatory Visit: Payer: Medicare Other | Admitting: Cardiology

## 2021-01-01 ENCOUNTER — Other Ambulatory Visit: Payer: Self-pay

## 2021-01-01 ENCOUNTER — Encounter: Payer: Self-pay | Admitting: Cardiology

## 2021-01-01 VITALS — BP 130/70 | HR 65 | Resp 18 | Ht 72.0 in | Wt 215.4 lb

## 2021-01-01 DIAGNOSIS — Z79899 Other long term (current) drug therapy: Secondary | ICD-10-CM

## 2021-01-01 DIAGNOSIS — I4819 Other persistent atrial fibrillation: Secondary | ICD-10-CM

## 2021-01-01 MED ORDER — DOFETILIDE 500 MCG PO CAPS: 500.0000 ug | ORAL_CAPSULE | Freq: Two times a day (BID) | ORAL | 2 refills | Status: DC

## 2021-01-01 MED ORDER — RIVAROXABAN 20 MG PO TABS: 20.0000 mg | ORAL_TABLET | Freq: Every day | ORAL | 2 refills | Status: DC

## 2021-01-01 NOTE — Patient Instructions (Signed)
Medication Instructions:  Your physician recommends that you continue on your current medications as directed. Please refer to the Current Medication list given to you today.  *If you need a refill on your cardiac medications before your next appointment, please call your pharmacy*   Lab Work: [place] surveillance labs today: BMET, CBC & Magnesium level  If you have labs (blood work) drawn today and your tests are completely normal, you will receive your results only by: MyChart Message (if you have MyChart) OR A paper copy in the mail If you have any lab test that is abnormal or we need to change your treatment, we will call you to review the results.   Testing/Procedures: None ordered   Follow-Up: At [facility], you and your health needs are our priority.  As part of our continuing mission to provide you with exceptional heart care, we have created designated Provider Care Teams.  These Care Teams include your primary Cardiologist (physician) and Advanced Practice Providers (APPs -  Physician Assistants and Nurse Practitioners) who all work together to provide you with the care you need, when you need it.  We recommend signing up for the patient portal called "MyChart".  Sign up information is provided on this After Visit Summary.  MyChart is used to connect with patients for Virtual Visits (Telemedicine).  Patients are able to view lab/test results, encounter notes, upcoming appointments, etc.  Non-urgent messages can be sent to your provider as well.   To learn more about what you can do with MyChart, go to [email or web address].    Your next appointment:   6 month(s)  The format for your next appointment:   In Person  Provider:   Allegra Lai, MD    Thank you for choosing [facility]!!   Trinidad Curet, RN [phone]

## 2021-01-01 NOTE — Progress Notes (Signed)
Electrophysiology Office Note   Date:  01/01/2021   ID:  [name] Sanford, DOB 05-09-1955, MRN [id number]  PCP:  [name] Flock, PA-C  Cardiologist:  [name] Sanford Primary Electrophysiologist:  [name] Sanford [name] Leeds, MD    Chief Complaint: AF   History of Present Illness: Emmons Sanford is a 65 y.o. male who is being seen today for the evaluation of AF at the request of [name] Sanford, [place]. Presenting today for electrophysiology evaluation.  He has a history significant for COPD, hypertension, diabetes, tobacco abuse, diastolic heart failure, paroxysmal atrial fibrillation.  He was diagnosed with atrial fibrillation 05/09/2019 after presenting to emergency room with symptoms of shortness of breath.  He was started on Xarelto for a CHA2DS2-VASc of 3.  He continued to have episodes of atrial fibrillation and has been loaded on dofetilide.  Today, denies symptoms of palpitations, chest pain, shortness of breath, orthopnea, PND, lower extremity edema, claudication, dizziness, presyncope, syncope, bleeding, or neurologic sequela. The patient is tolerating medications without difficulties.  He has been doing well.  He has had no chest pain or shortness of breath.  Able do all daily activities.  He has lost 85 pounds since last being seen.  He has changed his diet and has been exercising more frequently.  He states that he was a type I diabetic with an elevated A1c.  Since losing the weight, his blood sugars have been much better controlled.   Past Medical History:  Diagnosis Date   Abscess of right groin 08/25/2018   Acute exacerbation of CHF (congestive heart failure) ([place]) 05/09/2019   Anxiety and depression    Arthritis    Arthropathy of lumbar facet joint 01/17/2020   Atrial fibrillation (HCC)    Atrial fibrillation (HCC)    Atrial flutter ([place]) 05/18/2019   Back pain    Bilateral thoracic back pain 12/10/2013   Chronic back pain 12/10/2013   COPD (chronic obstructive pulmonary disease) ([place])     Hematuria, gross 06/19/2016   History of colon polyps    Hx of lumbosacral spine surgery 12/31/2016   Low back pain 09/13/2014   Malfunction of spinal cord stimulator ([place]) 04/01/2017   Mechanical complication nervous system device/implant/graft 12/10/2013   Nocturnal hypoxemia 09/03/2019   Pain in right knee 05/13/2019   Paroxysmal atrial fibrillation ([place]) 05/18/2019   Persistent atrial fibrillation ([place]) 06/08/2019   Post laminectomy syndrome 04/01/2017   Precordial pain 07/19/2016   Recurrent UTI 06/04/2016   Right inguinal pain 08/25/2018   Secondary hypercoagulable state ([place]) 05/18/2019   Smoking 07/19/2016   Status post lumbar spinal fusion 09/13/2014   Visit for monitoring Tikosyn therapy 06/08/2019   Past Surgical History:  Procedure Laterality Date   BRAIN SURGERY     COLONOSCOPY  10/28/2016   Colonic polyps status post polypectomy. Internal hemorrhoids (likely etiology of rectal bleeding-no active bleeding currently)   NECK SURGERY     SHOULDER SURGERY     SPINAL CORD STIMULATOR IMPLANT     WRIST SURGERY       Current Outpatient Medications  Medication Sig Dispense Refill   albuterol (PROVENTIL) (2.5 MG/3ML) 0.083% nebulizer solution Inhale 3 mLs into the lungs every 4 (four) hours as needed for shortness of breath.     HYDROmorphone HCl ER 16 MG TB24 Take 16 mg by mouth daily.      LINZESS 290 MCG CAPS capsule Take 290 mcg by mouth daily before breakfast.      mometasone (ELOCON) 0.1 % cream Apply 1 application topically as  needed (rosacea).      oxycodone (ROXICODONE) 30 MG immediate release tablet Take 30 mg by mouth every 4 (four) hours as needed for pain.     testosterone cypionate (DEPOTESTOSTERONE CYPIONATE) 200 MG/ML injection Inject 200 mg into the muscle every 14 (fourteen) days.     dofetilide (TIKOSYN) 500 MCG capsule Take 1 capsule (500 mcg total) by mouth 2 (two) times daily. 180 capsule 2   rivaroxaban (XARELTO) 20 MG TABS tablet Take 1 tablet (20 mg total) by mouth  daily with supper. 90 tablet 2   No current facility-administered medications for this visit.    Allergies:   Oxymorphone and Oxymorphone hcl   Social History:  The patient  reports that he has been smoking cigarettes. He has a 45.00 pack-year smoking history. He has never used smokeless tobacco. He reports that he does not currently use alcohol. He reports that he does not use drugs.   Family History:  The patient's family history includes Cancer in his father and mother; Heart disease in his brother and father; Ovarian cancer in his sister.   ROS:  Please see the history of present illness.   Otherwise, review of systems is positive for none.   All other systems are reviewed and negative.   PHYSICAL EXAM: VS:  BP 130/70   Pulse 65   Resp 18   Ht 6' (1.829 m)   Wt 215 lb 6.4 oz (97.7 kg)   SpO2 98%   BMI 29.21 kg/m  , BMI Body mass index is 29.21 kg/m. GEN: Well nourished, well developed, in no acute distress  HEENT: normal  Neck: no JVD, carotid bruits, or masses Cardiac: RRR; no murmurs, rubs, or gallops,no edema  Respiratory:  clear to auscultation bilaterally, normal work of breathing GI: soft, nontender, nondistended, + BS MS: no deformity or atrophy  Skin: warm and dry Neuro:  Strength and sensation are intact Psych: euthymic mood, full affect  EKG:  EKG is ordered today. Personal review of the ekg ordered shows sinus rhythm, rate 65  Recent Labs: No results found for requested labs within last 8760 hours.    Lipid Panel     Component Value Date/Time   CHOL 191 05/09/2019 1537   TRIG 63 05/09/2019 1537   HDL 47 05/09/2019 1537   CHOLHDL 4.1 05/09/2019 1537   VLDL 13 05/09/2019 1537   LDLCALC 131 (H) 05/09/2019 1537     Wt Readings from Last 3 Encounters:  01/01/21 215 lb 6.4 oz (97.7 kg)  08/29/20 222 lb (100.7 kg)  07/20/20 222 lb (100.7 kg)      Other studies Reviewed: Additional studies/ records that were reviewed today include: TTE  05/09/2019 Review of the above records today demonstrates:   1. Poor acoustic windows limit study.   2. Left ventricular ejection fraction, by estimation, is 55 to 60%. The  left ventricle has normal function. The left ventricle has no regional  wall motion abnormalities. There is moderate left ventricular hypertrophy.  Left ventricular diastolic  parameters are indeterminate.   3. Right ventricular systolic function is normal. The right ventricular  size is normal.   4. The mitral valve is abnormal. No evidence of mitral valve  regurgitation.   5. The aortic valve is normal in structure. Aortic valve regurgitation is  not visualized.   6. The inferior vena cava is dilated in size with <50% respiratory  variability, suggesting right atrial pressure of 15 mmHg.    ASSESSMENT AND PLAN:  1.  Persistent atrial fibrillation: CHA2DS2-VASc of 3.  Currently on dofetilide 500 mcg twice daily, Xarelto 20 mg daily.  High risk medication monitoring for dofetilide via ECG and labs performed today.  He has remained in sinus rhythm.  He currently feels well.  No changes at this time.  2.  Obesity: BMI was up to 40.  He changed his diet and started exercising.  His BMI is now down to 29.  He is lost 85 pounds.  He currently feels well.  3.  Chronic diastolic heart failure: No obvious volume overload.  Has follow-up with general cardiology.   Current medicines are reviewed at length with the patient today.   The patient does not have concerns regarding his medicines.  The following changes were made today:  none  Labs/ tests ordered today include:  Orders Placed This Encounter  Procedures   Basic metabolic panel   CBC   Magnesium   EKG 12-Lead      Disposition:   FU with Chantea Surace 6 months  Signed, Margean Korell [name] Leeds, MD  01/01/2021 11:15 AM     [facility] [address] [place] [address] [phone] (office) [phone] (fax)

## 2021-01-02 DIAGNOSIS — Z860100 Personal history of colon polyps, unspecified: Secondary | ICD-10-CM | POA: Insufficient documentation

## 2021-01-02 DIAGNOSIS — F419 Anxiety disorder, unspecified: Secondary | ICD-10-CM | POA: Insufficient documentation

## 2021-01-02 DIAGNOSIS — F32A Depression, unspecified: Secondary | ICD-10-CM | POA: Insufficient documentation

## 2021-01-02 LAB — CBC
Hematocrit: 48.1 % (ref 37.5–51.0)
Hemoglobin: 16.2 g/dL (ref 13.0–17.7)
MCH: 29.5 pg (ref 26.6–33.0)
MCHC: 33.7 g/dL (ref 31.5–35.7)
MCV: 88 fL (ref 79–97)
Platelets: 234 10*3/uL (ref 150–450)
RBC: 5.5 x10E6/uL (ref 4.14–5.80)
RDW: 12.7 % (ref 11.6–15.4)
WBC: 6.2 10*3/uL (ref 3.4–10.8)

## 2021-01-02 LAB — BASIC METABOLIC PANEL
BUN/Creatinine Ratio: 17 (ref 10–24)
BUN: 17 mg/dL (ref 8–27)
CO2: 26 mmol/L (ref 20–29)
Calcium: 8.9 mg/dL (ref 8.6–10.2)
Chloride: 100 mmol/L (ref 96–106)
Creatinine, Ser: 0.98 mg/dL (ref 0.76–1.27)
Glucose: 129 mg/dL — ABNORMAL HIGH (ref 70–99)
Potassium: 4.3 mmol/L (ref 3.5–5.2)
Sodium: 139 mmol/L (ref 134–144)
eGFR: 87 mL/min/{1.73_m2} (ref >59)

## 2021-01-02 LAB — MAGNESIUM: Magnesium: 2 mg/dL (ref 1.6–2.3)

## 2021-01-03 ENCOUNTER — Ambulatory Visit: Payer: Medicare Other | Admitting: Cardiology

## 2021-05-23 ENCOUNTER — Ambulatory Visit: Payer: Medicare Other | Admitting: Cardiology

## 2021-07-12 DIAGNOSIS — M19019 Primary osteoarthritis, unspecified shoulder: Secondary | ICD-10-CM | POA: Insufficient documentation

## 2021-07-12 DIAGNOSIS — S43432A Superior glenoid labrum lesion of left shoulder, initial encounter: Secondary | ICD-10-CM

## 2021-07-12 HISTORY — DX: Superior glenoid labrum lesion of left shoulder, initial encounter: S43.432A

## 2021-08-02 IMAGING — CT CT ABD-PELV W/ CM
2 of 5 series · 16 of 46 positions shown, 18 images · IV contrast (Omni 300)
Comparison: None.

CLINICAL DATA: 63-year-old male with abdominal pain.

EXAM:
CT ABDOMEN AND PELVIS WITH CONTRAST
TECHNIQUE: Multidetector CT imaging of the abdomen and pelvis was performed
using the standard protocol following bolus administration of
intravenous contrast.
CONTRAST:  125mL OMNIPAQUE IOHEXOL 300 MG/ML  SOLN

[Series 3: a/p w/ 5mm · axial · 0.98mm/px · z∈[+776,+1186]mm · 13 of 92 slices shown, 15 images]
[im 5/92  soft-tissue]
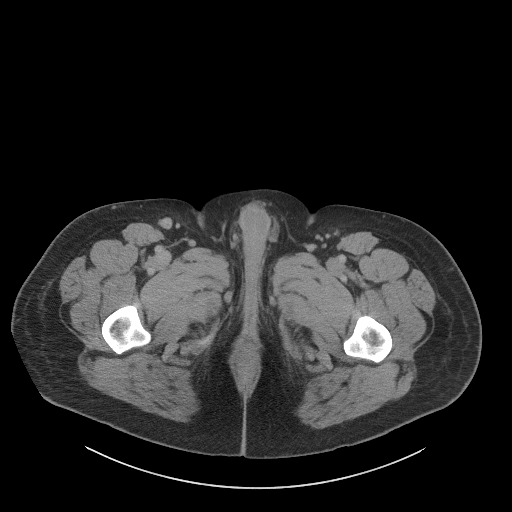
[im 5/92  bone]
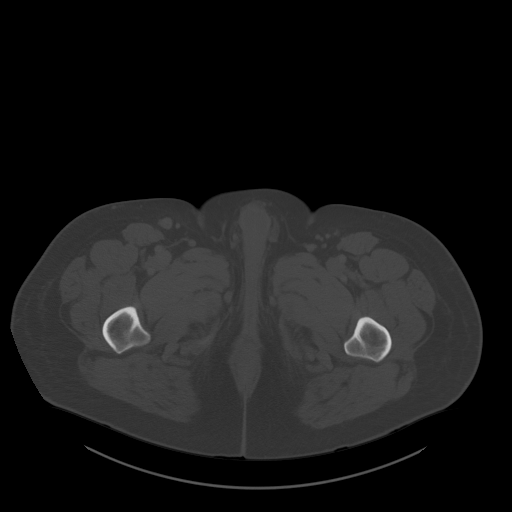
[im 15/92  soft-tissue]
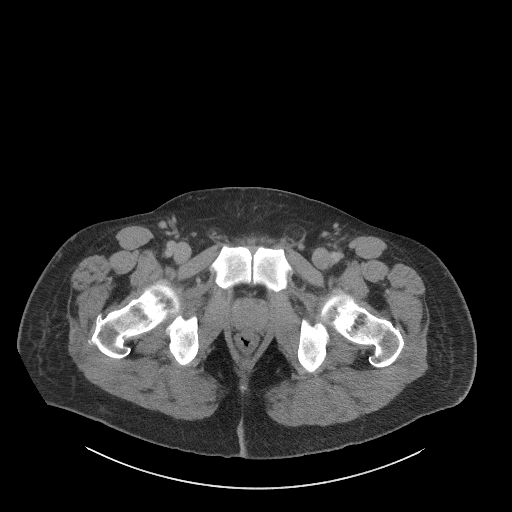
[im 20/92  soft-tissue]
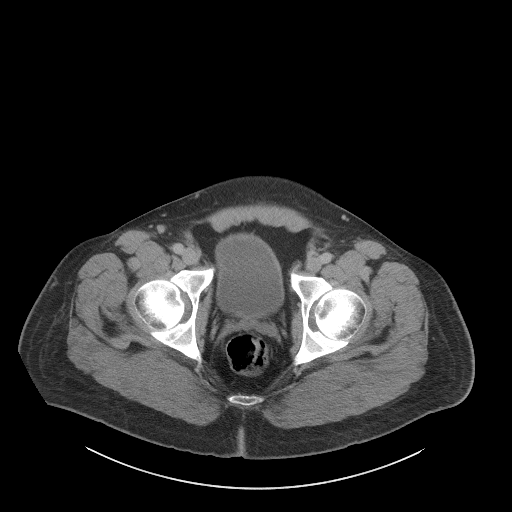
[im 24/92  soft-tissue]
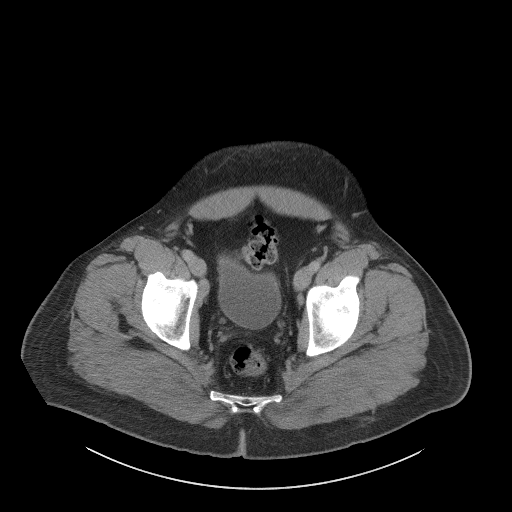
[im 34/92  soft-tissue]
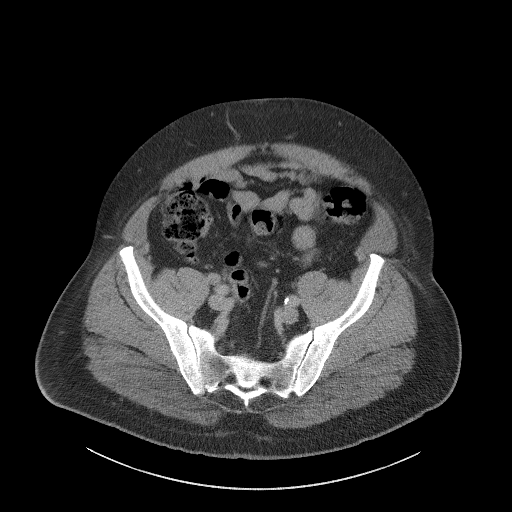
[im 39/92  soft-tissue]
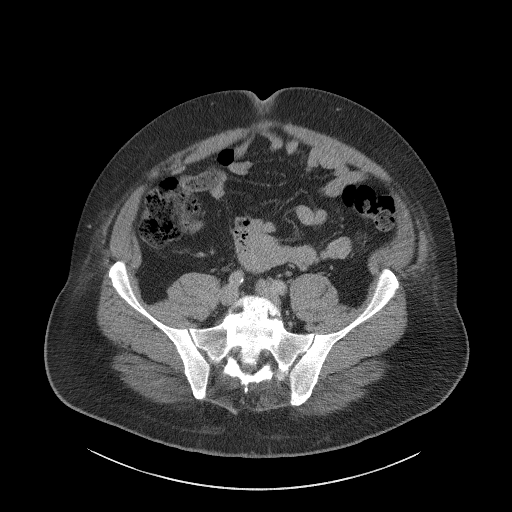
[im 48/92  soft-tissue]
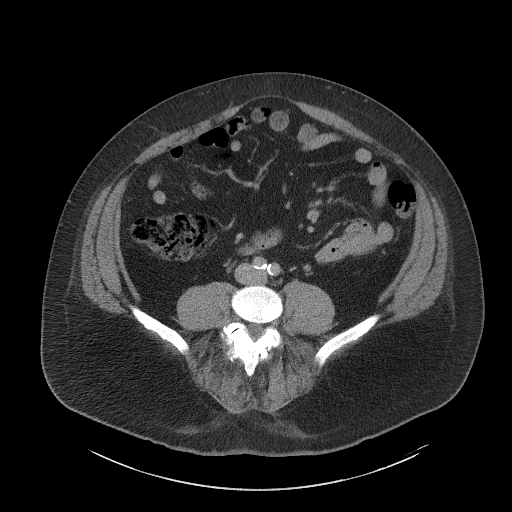
[im 53/92  soft-tissue]
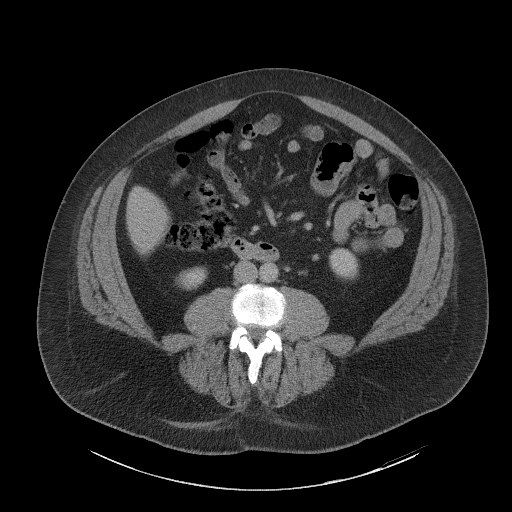
[im 58/92  soft-tissue]
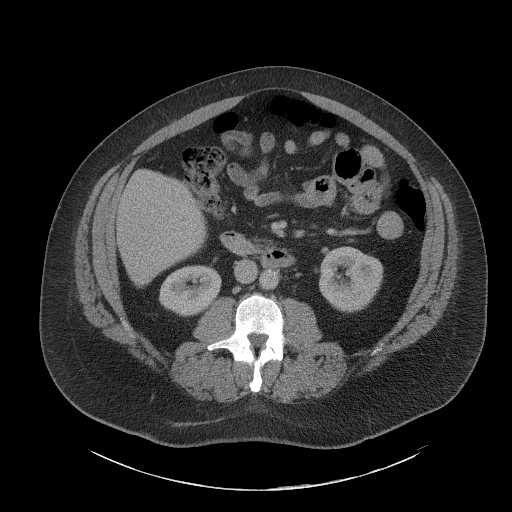
[im 58/92  bone]
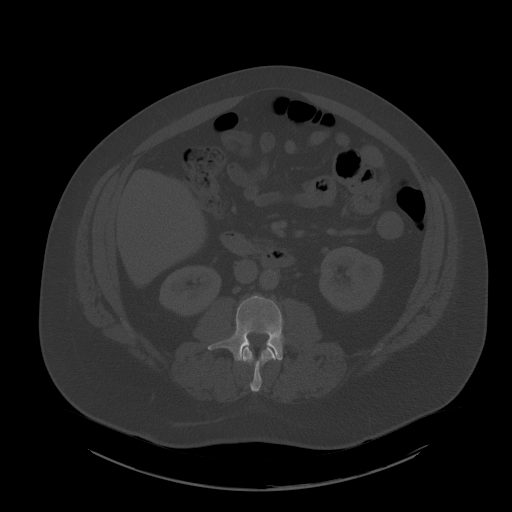
[im 68/92  soft-tissue]
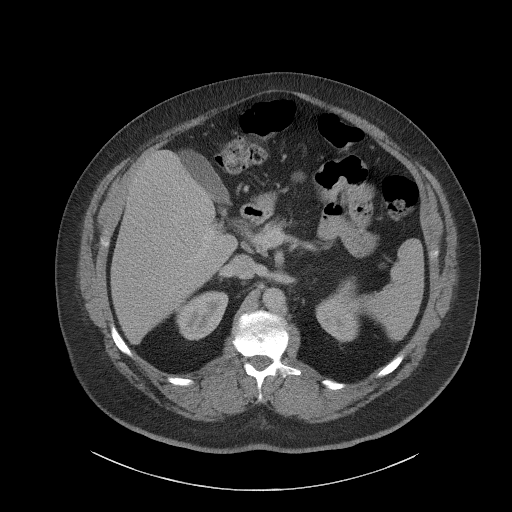
[im 72/92  soft-tissue]
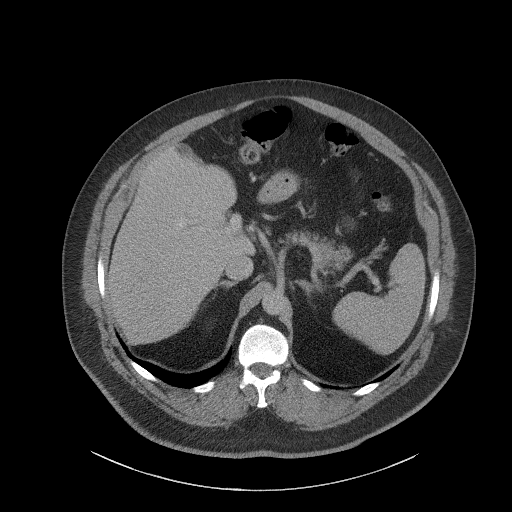
[im 77/92  soft-tissue]
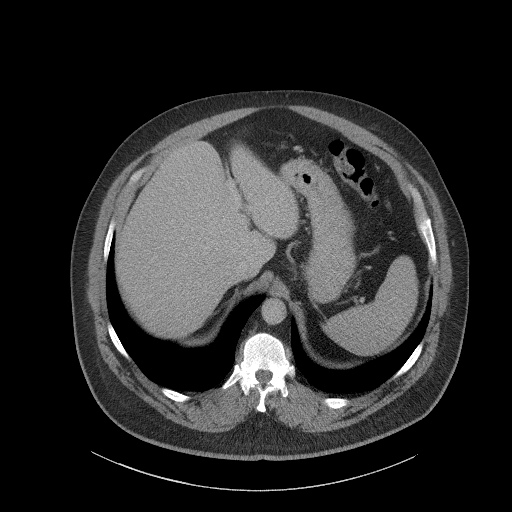
[im 87/92  soft-tissue]
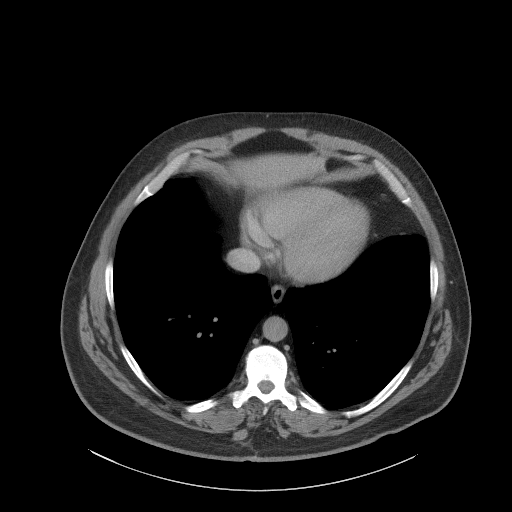

[Series 6: a/p w/ cor · coronal · 0.88mm/px · 3 of 182 slices shown]
[im 61/182  soft-tissue]
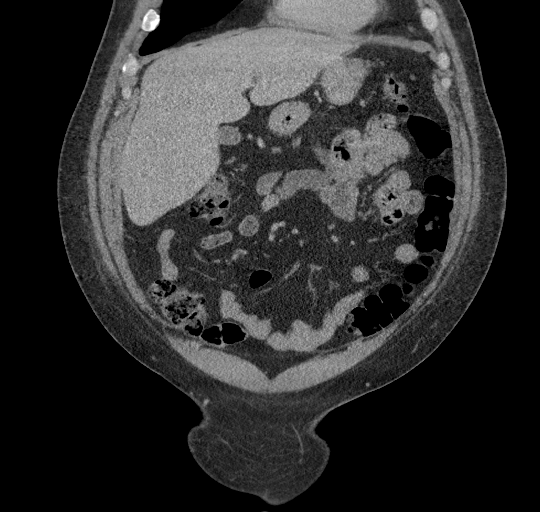
[im 81/182  soft-tissue]
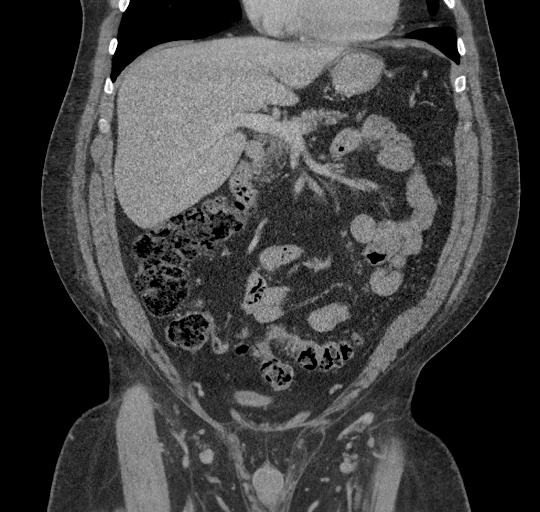
[im 101/182  soft-tissue]
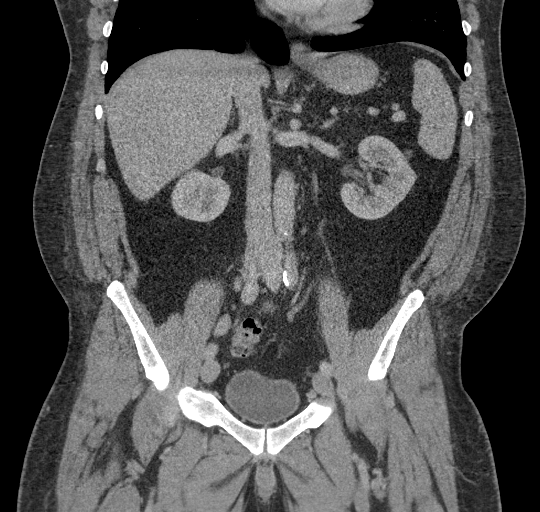

[16 of 46 positions shown; findings below may reference images not displayed]

FINDINGS: Lower chest: The visualized lung bases are clear.

No intra-abdominal free air or free fluid.

Hepatobiliary: No focal liver abnormality is seen. No gallstones,
gallbladder wall thickening, or biliary dilatation.

Pancreas: Unremarkable. No pancreatic ductal dilatation or
surrounding inflammatory changes.

Spleen: Normal in size without focal abnormality.

Adrenals/Urinary Tract: The adrenal glands unremarkable. There is no
hydronephrosis on either side. There is symmetric enhancement and
excretion of contrast by both kidneys. Subcentimeter left renal
hypodense lesions are too small to characterize. The visualized
ureters and urinary bladder appear unremarkable.

Stomach/Bowel: There is no bowel obstruction or active inflammation.
The appendix is normal.

Vascular/Lymphatic: Mild aortoiliac atherosclerotic disease. The IVC
is unremarkable. No portal venous gas. There is no adenopathy.

Reproductive: The prostate and seminal vesicles are grossly
unremarkable. No pelvic mass.

Other: There is a small fat containing umbilical hernia as well as a
small fat containing hernia just above the umbilicus. There is mild
stranding of the skin and subcutaneous fat at the umbilicus.
Correlation with clinical exam is recommended to evaluate for
possibility of strangulation. No drainable fluid collection noted.
No herniated bowel.

Musculoskeletal: L5-S1 disc spacer and posterior fusion. No acute
osseous pathology. Degenerative changes of the spine.
IMPRESSION: 1. No acute intra-abdominal or pelvic pathology. No bowel
obstruction. Normal appendix.
2. Small fat containing umbilical hernia with possible
strangulation. Clinical correlation is recommended. No drainable
fluid collection.
3. Aortic Atherosclerosis (Q6VD1-RZ9.9).

## 2021-08-16 ENCOUNTER — Other Ambulatory Visit: Payer: Self-pay

## 2021-08-16 ENCOUNTER — Telehealth: Payer: Self-pay | Admitting: Cardiology

## 2021-08-16 ENCOUNTER — Ambulatory Visit: Payer: Medicare Other | Admitting: Cardiology

## 2021-08-16 ENCOUNTER — Encounter: Payer: Self-pay | Admitting: Cardiology

## 2021-08-16 VITALS — BP 122/70 | HR 61 | Ht 72.0 in | Wt 210.4 lb

## 2021-08-16 DIAGNOSIS — F17200 Nicotine dependence, unspecified, uncomplicated: Secondary | ICD-10-CM | POA: Diagnosis not present

## 2021-08-16 DIAGNOSIS — I480 Paroxysmal atrial fibrillation: Secondary | ICD-10-CM | POA: Diagnosis not present

## 2021-08-16 DIAGNOSIS — Z79899 Other long term (current) drug therapy: Secondary | ICD-10-CM

## 2021-08-16 DIAGNOSIS — J449 Chronic obstructive pulmonary disease, unspecified: Secondary | ICD-10-CM

## 2021-08-16 DIAGNOSIS — Z5181 Encounter for therapeutic drug level monitoring: Secondary | ICD-10-CM

## 2021-08-16 MED ORDER — ATORVASTATIN CALCIUM 10 MG PO TABS: 10.0000 mg | ORAL_TABLET | Freq: Every day | ORAL | 3 refills | Status: DC

## 2021-08-16 MED ORDER — ROSUVASTATIN CALCIUM 5 MG PO TABS: 5.0000 mg | ORAL_TABLET | Freq: Every day | ORAL | 3 refills | Status: DC

## 2021-08-16 NOTE — Patient Instructions (Signed)
Medication Instructions:  Your physician has recommended you make the following change in your medication:   START: Lipitor 10 mg daily  *If you need a refill on your cardiac medications before your next appointment, please call your pharmacy*   Lab Work: Your physician recommends that you return for lab work in:   Labs in 6 weeks: Lipids, LFT  If you have labs (blood work) drawn today and your tests are completely normal, you will receive your results only by: [place] (if you have [place]) OR A paper copy in the mail If you have any lab test that is abnormal or we need to change your treatment, we will call you to review the results.   Testing/Procedures: None   Follow-Up: At [facility], you and your health needs are our priority.  As part of our continuing mission to provide you with exceptional heart care, we have created designated Provider Care Teams.  These Care Teams include your primary Cardiologist (physician) and Advanced Practice Providers (APPs -  Physician Assistants and Nurse Practitioners) who all work together to provide you with the care you need, when you need it.  We recommend signing up for the patient portal called "MyChart".  Sign up information is provided on this After Visit Summary.  MyChart is used to connect with patients for Virtual Visits (Telemedicine).  Patients are able to view lab/test results, encounter notes, upcoming appointments, etc.  Non-urgent messages can be sent to your provider as well.   To learn more about what you can do with MyChart, go to [email or web address].    Your next appointment:   6 month(s)  The format for your next appointment:   In Person  Provider:   Jenne Campus, MD    Other Instructions None  Important Information About Sugar

## 2021-08-16 NOTE — Progress Notes (Signed)
Cardiology Office Note:    Date:  08/16/2021   ID:  [name] Sanford, DOB [birth year], MRN [id number]  PCP:  [name] Flock, PA-C  Cardiologist:  Jenne Campus, MD    Referring MD: [name] Sanford, [place]   Chief Complaint  Patient presents with   Follow-up         History of Present Illness:    [name] Sanford is a 66 y.o. male   with past medical history significant for essential hypertension, diabetes, paroxysmal atrial fibrillation, anticoagulated with Xarelto and suppressed with Tikosyn, smoking, obesity.  He was initially diagnosed with atrial fibrillation March 2021 after presented to the emergency room symptoms of shortness of breath he was fine acutely fluid for overloaded with preserved left ventricle ejection fraction he was initiated with Xarelto for his CHADS2 Vascor equals 3 and then he spontaneously converted to sinus rhythm.  Then he was loaded with dofetilide which was done in April 2021 and she seems to maintaining sinus rhythm since then. Comes today to my office for follow-up overall doing well.  Denies have any chest pain tightness squeezing pressure burning chest.  No dizziness no passing out no palpitations overall seems to doing well  Past Medical History:  Diagnosis Date   Abscess of right groin 08/25/2018   Acute exacerbation of CHF (congestive heart failure) ([place]) 05/09/2019   Anxiety and depression    Arthritis    Arthropathy of lumbar facet joint 01/17/2020   Atrial fibrillation (HCC)    Atrial fibrillation (HCC)    Atrial flutter ([place]) 05/18/2019   Back pain    Bilateral thoracic back pain 12/10/2013   Chronic back pain 12/10/2013   COPD (chronic obstructive pulmonary disease) ([place])    Hematuria, gross 06/19/2016   History of colon polyps    Hx of lumbosacral spine surgery 12/31/2016   Low back pain 09/13/2014   Malfunction of spinal cord stimulator ([place]) 04/01/2017   Mechanical complication nervous system device/implant/graft 12/10/2013   Nocturnal hypoxemia  09/03/2019   Pain in right knee 05/13/2019   Paroxysmal atrial fibrillation ([place]) 05/18/2019   Persistent atrial fibrillation ([place]) 06/08/2019   Post laminectomy syndrome 04/01/2017   Precordial pain 07/19/2016   Recurrent UTI 06/04/2016   Right inguinal pain 08/25/2018   Secondary hypercoagulable state ([place]) 05/18/2019   Smoking 07/19/2016   Status post lumbar spinal fusion 09/13/2014   Visit for monitoring Tikosyn therapy 06/08/2019    Past Surgical History:  Procedure Laterality Date   BRAIN SURGERY     COLONOSCOPY  10/28/2016   Colonic polyps status post polypectomy. Internal hemorrhoids (likely etiology of rectal bleeding-no active bleeding currently)   NECK SURGERY     SHOULDER SURGERY     SPINAL CORD STIMULATOR IMPLANT     WRIST SURGERY      Current Medications: Current Meds  Medication Sig   albuterol (PROVENTIL) (2.5 MG/3ML) 0.083% nebulizer solution Inhale 3 mLs into the lungs every 4 (four) hours as needed for shortness of breath.   dofetilide (TIKOSYN) 500 MCG capsule Take 1 capsule (500 mcg total) by mouth 2 (two) times daily.   HYDROmorphone HCl ER 16 MG TB24 Take 16 mg by mouth daily.    LINZESS 290 MCG CAPS capsule Take 290 mcg by mouth daily before breakfast.    mometasone (ELOCON) 0.1 % cream Apply 1 application topically as needed (rosacea).    oxycodone (ROXICODONE) 30 MG immediate release tablet Take 15-30 mg by mouth every 4 (four) hours as needed for pain.   rivaroxaban (  XARELTO) 20 MG TABS tablet Take 1 tablet (20 mg total) by mouth daily with supper.   testosterone cypionate (DEPOTESTOSTERONE CYPIONATE) 200 MG/ML injection Inject 200 mg into the muscle every 14 (fourteen) days.     Allergies:   Oxymorphone and Oxymorphone hcl   Social History   Socioeconomic History   Marital status: Married    Spouse name: Not on file   Number of children: Not on file   Years of education: Not on file   Highest education level: Not on file  Occupational History    Occupation: Disabled  Tobacco Use   Smoking status: Every Day    Packs/day: 1.00    Years: 45.00    Total pack years: 45.00    Types: Cigarettes   Smokeless tobacco: Never   Tobacco comments:    20-30 cigarettes daily 09/03/19 AR[facility]   Vaping Use   Vaping Use: Former  Substance and Sexual Activity   Alcohol use: Not Currently   Drug use: Never   Sexual activity: Not on file  Other Topics Concern   Not on file  Social History Narrative   Not on file   Social Determinants of Health   Financial Resource Strain: Not on file  Food Insecurity: Not on file  Transportation Needs: Not on file  Physical Activity: Not on file  Stress: Not on file  Social Connections: Not on file     Family History: The patient's family history includes Cancer in his father and mother; Heart disease in his brother and father; Ovarian cancer in his sister. There is no history of Colon cancer, Esophageal cancer, or Stomach cancer. ROS:   Please see the history of present illness.    All 14 point review of systems negative except as described per history of present illness  EKGs/Labs/Other Studies Reviewed:      Recent Labs: 01/01/2021: BUN 17; Creatinine, Ser 0.98; Hemoglobin 16.2; Magnesium 2.0; Platelets 234; Potassium 4.3; Sodium 139  Recent Lipid Panel    Component Value Date/Time   CHOL 191 05/09/2019 1537   TRIG 63 05/09/2019 1537   HDL 47 05/09/2019 1537   CHOLHDL 4.1 05/09/2019 1537   VLDL 13 05/09/2019 1537   LDLCALC 131 (H) 05/09/2019 1537    Physical Exam:    VS:  BP 122/70 (BP Location: Left Arm, Patient Position: Sitting)   Pulse 61   Ht 6' (1.829 m)   Wt 210 lb 6.4 oz (95.4 kg)   SpO2 92%   BMI 28.54 kg/m     Wt Readings from Last 3 Encounters:  08/16/21 210 lb 6.4 oz (95.4 kg)  01/01/21 215 lb 6.4 oz (97.7 kg)  08/29/20 222 lb (100.7 kg)     GEN:  Well nourished, well developed in no acute distress HEENT: Normal NECK: No JVD; No carotid bruits LYMPHATICS: No  lymphadenopathy CARDIAC: RRR, no murmurs, no rubs, no gallops RESPIRATORY:  Clear to auscultation without rales, wheezing or rhonchi  ABDOMEN: Soft, non-tender, non-distended MUSCULOSKELETAL:  No edema; No deformity  SKIN: Warm and dry LOWER EXTREMITIES: no swelling NEUROLOGIC:  Alert and oriented x 3 PSYCHIATRIC:  Normal affect   ASSESSMENT:    1. Paroxysmal atrial fibrillation (HCC)   2. Chronic obstructive pulmonary disease, unspecified COPD type ([place])   3. Visit for monitoring Tikosyn therapy   4. Smoking    PLAN:    In order of problems listed above:  Paroxysmal atrial fibrillation.  We will do EKG make sure he is in sinus rhythm  physical exam revealed very regular rhythm therefore I suspect he is in sinus rhythm, I will also check QT interval since he is on Tikosyn.  Continue anticoagulation, his CHADS2 Vascor equals 3 COPD is a problem.  He understand him to work on smoking.  Hopefully he will be able to establish that Monitoring for Tikosyn use.  EKG will be done today. Dyslipidemia, I did review his K PN which show LDL 131 HDL 47, I calculated his 10 years predicted cardiovascular risk which is 14.7 which is intermediate he accepted my offer to start taking Lipitor which I will initiate.  6 weeks later we will check fasting lipid profile AST ALT   Medication Adjustments/Labs and Tests Ordered: Current medicines are reviewed at length with the patient today.  Concerns regarding medicines are outlined above.  No orders of the defined types were placed in this encounter.  Medication changes: No orders of the defined types were placed in this encounter.   Signed, Park Liter, MD, [facility] 08/16/2021 10:11 AM    [place]

## 2021-08-16 NOTE — Telephone Encounter (Signed)
Pt c/o medication issue:  1. Name of Medication: atorvastatin (LIPITOR) 10 MG tablet  2. How are you currently taking this medication (dosage and times per day)? Take 1 tablet (10 mg total) by mouth daily.  3. Are you having a reaction (difficulty breathing--STAT)?   4. What is your medication issue? Patient states he was on the medication before and it messed with his joints.  He is wondering if there is something else that be given to him.Marland Kitchen

## 2021-08-16 NOTE — Telephone Encounter (Signed)
Called patient and informed him of Dr. Agustin Cree recommending that he try Crestor 5 mg instead of the lisinopril. Patient was agreeable to this plan and the medication orders were placed via Epic and sent to his pharmacy. Patient had no further questions at this time.

## 2021-08-20 ENCOUNTER — Other Ambulatory Visit: Payer: Self-pay

## 2021-08-20 ENCOUNTER — Ambulatory Visit: Payer: Medicare Other | Admitting: Cardiology

## 2021-08-20 ENCOUNTER — Encounter: Payer: Self-pay | Admitting: Cardiology

## 2021-08-20 VITALS — BP 130/62 | HR 58 | Ht 72.0 in | Wt 211.0 lb

## 2021-08-20 DIAGNOSIS — D6869 Other thrombophilia: Secondary | ICD-10-CM | POA: Diagnosis not present

## 2021-08-20 DIAGNOSIS — I4819 Other persistent atrial fibrillation: Secondary | ICD-10-CM

## 2021-08-20 NOTE — Progress Notes (Signed)
Electrophysiology Office Note   Date:  08/20/2021   ID:  [name] Sanford, DOB [birth year], MRN [id number]  PCP:  Dustin Flock, PA-C  Cardiologist:  Agustin Cree Primary Electrophysiologist:  Jannine Abreu Meredith Leeds, MD    Chief Complaint: AF   History of Present Illness: [name] Sanford is a 66 y.o. male who is being seen today for the evaluation of AF at the request of Dustin Flock, [place]. Presenting today for electrophysiology evaluation.  He has a history significant for COPD, hypertension, diabetes, tobacco abuse, diastolic heart failure, paroxysmal atrial fibrillation.  He was diagnosed with atrial fibrillation in 2021.  He was started on Xarelto.  He has since been loaded on dofetilide.  Today, denies symptoms of palpitations, chest pain, shortness of breath, orthopnea, PND, lower extremity edema, claudication, dizziness, presyncope, syncope, bleeding, or neurologic sequela. The patient is tolerating medications without difficulties.  Since being seen he has done well.  He has had no chest pain or shortness of breath.  He is able to all of his daily activities.  He has noted no further episodes of atrial fibrillation.  His biggest issue is back pain.  Past Medical History:  Diagnosis Date   Abscess of right groin 08/25/2018   Acute exacerbation of CHF (congestive heart failure) ([place]) 05/09/2019   Anxiety and depression    Arthritis    Arthropathy of lumbar facet joint 01/17/2020   Atrial fibrillation (HCC)    Atrial fibrillation (HCC)    Atrial flutter ([place]) 05/18/2019   Back pain    Bilateral thoracic back pain 12/10/2013   Chronic back pain 12/10/2013   COPD (chronic obstructive pulmonary disease) ([place])    Hematuria, gross 06/19/2016   History of colon polyps    Hx of lumbosacral spine surgery 12/31/2016   Low back pain 09/13/2014   Malfunction of spinal cord stimulator ([place]) 04/01/2017   Mechanical complication nervous system device/implant/graft 12/10/2013   Nocturnal hypoxemia  09/03/2019   Pain in right knee 05/13/2019   Paroxysmal atrial fibrillation ([place]) 05/18/2019   Persistent atrial fibrillation ([place]) 06/08/2019   Post laminectomy syndrome 04/01/2017   Precordial pain 07/19/2016   Recurrent UTI 06/04/2016   Right inguinal pain 08/25/2018   Secondary hypercoagulable state ([place]) 05/18/2019   Smoking 07/19/2016   Status post lumbar spinal fusion 09/13/2014   Visit for monitoring Tikosyn therapy 06/08/2019   Past Surgical History:  Procedure Laterality Date   BRAIN SURGERY     COLONOSCOPY  10/28/2016   Colonic polyps status post polypectomy. Internal hemorrhoids (likely etiology of rectal bleeding-no active bleeding currently)   NECK SURGERY     SHOULDER SURGERY     SPINAL CORD STIMULATOR IMPLANT     WRIST SURGERY       Current Outpatient Medications  Medication Sig Dispense Refill   albuterol (PROVENTIL) (2.5 MG/3ML) 0.083% nebulizer solution Inhale 3 mLs into the lungs every 4 (four) hours as needed for shortness of breath.     atorvastatin (LIPITOR) 10 MG tablet Take 1 tablet (10 mg total) by mouth daily. 90 tablet 3   dofetilide (TIKOSYN) 500 MCG capsule Take 1 capsule (500 mcg total) by mouth 2 (two) times daily. 180 capsule 2   HYDROmorphone HCl ER 16 MG TB24 Take 16 mg by mouth daily.      LINZESS 290 MCG CAPS capsule Take 290 mcg by mouth daily before breakfast.      mometasone (ELOCON) 0.1 % cream Apply 1 application topically as needed (rosacea).      oxycodone (  ROXICODONE) 30 MG immediate release tablet Take 15-30 mg by mouth every 4 (four) hours as needed for pain.     rivaroxaban (XARELTO) 20 MG TABS tablet Take 1 tablet (20 mg total) by mouth daily with supper. 90 tablet 2   rosuvastatin (CRESTOR) 5 MG tablet Take 1 tablet (5 mg total) by mouth daily. 90 tablet 3   testosterone cypionate (DEPOTESTOSTERONE CYPIONATE) 200 MG/ML injection Inject 200 mg into the muscle every 14 (fourteen) days.     No current facility-administered medications for this  visit.    Allergies:   Oxymorphone and Oxymorphone hcl   Social History:  The patient  reports that he has been smoking cigarettes. He has a 45.00 pack-year smoking history. He has never used smokeless tobacco. He reports that he does not currently use alcohol. He reports that he does not use drugs.   Family History:  The patient's family history includes Cancer in his father and mother; Heart disease in his brother and father; Ovarian cancer in his sister.   ROS:  Please see the history of present illness.   Otherwise, review of systems is positive for none.   All other systems are reviewed and negative.   PHYSICAL EXAM: VS:  BP 130/62   Pulse (!) 58   Ht 6' (1.829 m)   Wt 211 lb (95.7 kg)   SpO2 97%   BMI 28.62 kg/m  , BMI Body mass index is 28.62 kg/m. GEN: Well nourished, well developed, in no acute distress  HEENT: normal  Neck: no JVD, carotid bruits, or masses Cardiac: RRR; no murmurs, rubs, or gallops,no edema  Respiratory:  clear to auscultation bilaterally, normal work of breathing GI: soft, nontender, nondistended, + BS MS: no deformity or atrophy  Skin: warm and dry Neuro:  Strength and sensation are intact Psych: euthymic mood, full affect  EKG:  EKG is not ordered today. Personal review of the ekg ordered 08/16/21 shows sinus rhythm   Recent Labs: 01/01/2021: BUN 17; Creatinine, Ser 0.98; Hemoglobin 16.2; Magnesium 2.0; Platelets 234; Potassium 4.3; Sodium 139    Lipid Panel     Component Value Date/Time   CHOL 191 05/09/2019 1537   TRIG 63 05/09/2019 1537   HDL 47 05/09/2019 1537   CHOLHDL 4.1 05/09/2019 1537   VLDL 13 05/09/2019 1537   LDLCALC 131 (H) 05/09/2019 1537     Wt Readings from Last 3 Encounters:  08/20/21 211 lb (95.7 kg)  08/16/21 210 lb 6.4 oz (95.4 kg)  01/01/21 215 lb 6.4 oz (97.7 kg)      Other studies Reviewed: Additional studies/ records that were reviewed today include: TTE 05/09/2019 Review of the above records today  demonstrates:   1. Poor acoustic windows limit study.   2. Left ventricular ejection fraction, by estimation, is 55 to 60%. The  left ventricle has normal function. The left ventricle has no regional  wall motion abnormalities. There is moderate left ventricular hypertrophy.  Left ventricular diastolic  parameters are indeterminate.   3. Right ventricular systolic function is normal. The right ventricular  size is normal.   4. The mitral valve is abnormal. No evidence of mitral valve  regurgitation.   5. The aortic valve is normal in structure. Aortic valve regurgitation is  not visualized.   6. The inferior vena cava is dilated in size with <50% respiratory  variability, suggesting right atrial pressure of 15 mmHg.    ASSESSMENT AND PLAN:  1.  Persistent atrial fibrillation: CHA2DS2-VASc of 3.  Currently on dofetilide 500 mcg twice daily, Xarelto 20 mg daily.  High risk medication monitoring for dofetilide via ECG and labs.  He remains in normal rhythm.  No changes at this time.  2.  Obesity: Diet and exercise encouraged Body mass index is 28.62 kg/m.  3.  Chronic diastolic heart failure: No obvious volume overload.  Plan per primary cardiology.  4.  Secondary hypercoagulable state: Currently on Xarelto for atrial fibrillation as above   current medicines are reviewed at length with the patient today.   The patient does not have concerns regarding his medicines.  The following changes were made today:  none  Labs/ tests ordered today include:  Orders Placed This Encounter  Procedures   Basic metabolic panel   CBC   Magnesium      Disposition:   FU with Saharra Santo 6 months  Signed, Jonelle Bann Meredith Leeds, MD  08/20/2021 3:54 PM     [facility] [address] [place]  [address] [phone] (office) [phone] (fax)

## 2021-08-20 NOTE — Patient Instructions (Signed)
Medication Instructions:  Your physician recommends that you continue on your current medications as directed. Please refer to the Current Medication list given to you today.  *If you need a refill on your cardiac medications before your next appointment, please call your pharmacy*   Lab Work: Bmp, Cbc, Magnesium - to be done in 6 weeks   If you have labs (blood work) drawn today and your tests are completely normal, you will receive your results only by: [place] (if you have MyChart) OR A paper copy in the mail If you have any lab test that is abnormal or we need to change your treatment, we will call you to review the results.   Testing/Procedures: None ordered today    Follow-Up: At [facility], you and your health needs are our priority.  As part of our continuing mission to provide you with exceptional heart care, we have created designated Provider Care Teams.  These Care Teams include your primary Cardiologist (physician) and Advanced Practice Providers (APPs -  Physician Assistants and Nurse Practitioners) who all work together to provide you with the care you need, when you need it.  We recommend signing up for the patient portal called "MyChart".  Sign up information is provided on this After Visit Summary.  MyChart is used to connect with patients for Virtual Visits (Telemedicine).  Patients are able to view lab/test results, encounter notes, upcoming appointments, etc.  Non-urgent messages can be sent to your provider as well.   To learn more about what you can do with MyChart, go to [email or web address].    Your next appointment:   6 month(s)  The format for your next appointment:   In Person  Provider:   You may see Will Meredith Leeds, MD or one of the following Advanced Practice Providers on your designated Care Team:   Tommye Standard, [place] Legrand Como "Jonni Sanger" Chalmers Cater, [place]    Other Instructions   Important Information About Sugar

## 2021-08-20 NOTE — Addendum Note (Signed)
Addended by: Mendel Ryder on: 08/20/2021 04:02 PM   Modules accepted: Orders

## 2021-08-23 ENCOUNTER — Telehealth: Payer: Self-pay | Admitting: Cardiology

## 2021-08-23 NOTE — Telephone Encounter (Signed)
Spoke with pt. He stated that he started Crestor and soon after noticed muscle aches, light colored stool, nausea & stomach burning. He stopped the Crestor and his symptoms are subsiding. Please advise.

## 2021-08-23 NOTE — Telephone Encounter (Signed)
Pt c/o medication issue:  1. Name of Medication: rosuvastatin (CRESTOR) 5 MG tablet  2. How are you currently taking this medication (dosage and times per day)? As written  3. Are you having a reaction (difficulty breathing--STAT)? Yes   4. What is your medication issue? Stool is really light, burning/cramping in stomach, nauscous

## 2021-09-03 MED ORDER — PRAVASTATIN SODIUM 20 MG PO TABS: 20.0000 mg | ORAL_TABLET | Freq: Every evening | ORAL | 3 refills | Status: DC

## 2021-09-03 NOTE — Telephone Encounter (Signed)
Spoke with pt about message and Dr. Wendy Poet note. Pt agreed to try Pravastatin 46m q d as per Dr. KWendy Poetnote. Sent to pharmacy.

## 2021-09-10 LAB — CBC
Hematocrit: 46.6 % (ref 37.5–51.0)
Hemoglobin: 16.1 g/dL (ref 13.0–17.7)
MCH: 30 pg (ref 26.6–33.0)
MCHC: 34.5 g/dL (ref 31.5–35.7)
MCV: 87 fL (ref 79–97)
Platelets: 208 10*3/uL (ref 150–450)
RBC: 5.36 x10E6/uL (ref 4.14–5.80)
RDW: 13.1 % (ref 11.6–15.4)
WBC: 7.3 10*3/uL (ref 3.4–10.8)

## 2021-09-10 LAB — BASIC METABOLIC PANEL WITH GFR
BUN/Creatinine Ratio: 13 (ref 10–24)
BUN: 12 mg/dL (ref 8–27)
CO2: 29 mmol/L (ref 20–29)
Calcium: 9.3 mg/dL (ref 8.6–10.2)
Chloride: 98 mmol/L (ref 96–106)
Creatinine, Ser: 0.95 mg/dL (ref 0.76–1.27)
Glucose: 101 mg/dL — ABNORMAL HIGH (ref 70–99)
Potassium: 4.3 mmol/L (ref 3.5–5.2)
Sodium: 138 mmol/L (ref 134–144)
eGFR: 89 mL/min/{1.73_m2}

## 2021-09-10 LAB — MAGNESIUM: Magnesium: 2.2 mg/dL (ref 1.6–2.3)

## 2021-09-11 LAB — HEPATIC FUNCTION PANEL
ALT: 14 IU/L (ref 0–44)
AST: 16 IU/L (ref 0–40)
Albumin: 4.4 g/dL (ref 3.9–4.9)
Alkaline Phosphatase: 94 IU/L (ref 44–121)
Bilirubin Total: 0.4 mg/dL (ref 0.0–1.2)
Bilirubin, Direct: 0.11 mg/dL (ref 0.00–0.40)
Total Protein: 7.1 g/dL (ref 6.0–8.5)

## 2021-09-11 LAB — LIPID PANEL
Chol/HDL Ratio: 2.9 ratio (ref 0.0–5.0)
Cholesterol, Total: 171 mg/dL (ref 100–199)
HDL: 60 mg/dL (ref >39)
LDL Chol Calc (NIH): 99 mg/dL (ref 0–99)
Triglycerides: 60 mg/dL (ref 0–149)
VLDL Cholesterol Cal: 12 mg/dL (ref 5–40)

## 2021-09-14 ENCOUNTER — Telehealth: Payer: Self-pay

## 2021-09-14 DIAGNOSIS — E785 Hyperlipidemia, unspecified: Secondary | ICD-10-CM

## 2021-09-14 MED ORDER — PRAVASTATIN SODIUM 40 MG PO TABS: 40.0000 mg | ORAL_TABLET | Freq: Every evening | ORAL | 2 refills | Status: DC

## 2021-09-14 NOTE — Telephone Encounter (Signed)
Patient notified of results and recommendations and agreed with plan. Rx sent. He is aware he can double up on his 20 mg daily until it runs out than, there will be a script at his pharmacy with new dose. Aware to be fasting for labs in 6 weeks. Lab order on file.

## 2021-09-14 NOTE — Telephone Encounter (Signed)
-----  Message from Park Liter, MD sent at 09/13/2021  9:43 AM EDT ----- Cholesterol need to be better, please double the dose of pravastatin to 40 mg daily, fasting lipid profile, AST ALT 6 weeks

## 2021-10-01 ENCOUNTER — Telehealth: Payer: Self-pay | Admitting: Cardiology

## 2021-10-01 ENCOUNTER — Telehealth: Payer: Self-pay

## 2021-10-01 MED ORDER — RIVAROXABAN 20 MG PO TABS: 20.0000 mg | ORAL_TABLET | Freq: Every day | ORAL | 1 refills | Status: DC

## 2021-10-01 MED ORDER — DOFETILIDE 500 MCG PO CAPS: 500.0000 ug | ORAL_CAPSULE | Freq: Two times a day (BID) | ORAL | 2 refills | Status: DC

## 2021-10-01 MED ORDER — EZETIMIBE 10 MG PO TABS: 10.0000 mg | ORAL_TABLET | Freq: Every day | ORAL | 3 refills | Status: DC

## 2021-10-01 NOTE — Addendum Note (Signed)
Addended by: Stanton Kidney on: 10/01/2021 02:40 PM   Modules accepted: Orders

## 2021-10-01 NOTE — Telephone Encounter (Signed)
Prescription refill request for Xarelto received.  Indication: AF Last office visit: 08/20/21  Elliot Cousin MD Weight: 95.7kg Age: 66 Scr: 0.95 on 09/10/21 CrCl: 106.33  Based on above findings Xarelto 50m daily is the appropriate dose.  Refill approved.

## 2021-10-01 NOTE — Telephone Encounter (Signed)
*  STAT* If patient is at the pharmacy, call can be transferred to refill team.   1. Which medications need to be refilled? (please list name of each medication and dose if known) rivaroxaban (XARELTO) 20 MG TABS tablet  dofetilide (TIKOSYN) 500 MCG capsule    2. Which pharmacy/location (including street and city if local pharmacy) is medication to be sent to? [facility] [place], [place]  3. Do they need a 30 day or 90 day supply? [place]

## 2021-10-01 NOTE — Telephone Encounter (Signed)
Pt called stating that he stopped Pravastatin due to hands, ears, feet peeling. Per Dr. Agustin Cree sent Zetia 1m q d to pharmacy.

## 2021-10-01 NOTE — Telephone Encounter (Signed)
Pt c/o medication issue:  1. Name of Medication:   pravastatin (PRAVACHOL) 40 MG tablet    2. How are you currently taking this medication (dosage and times per day)? Take 1 tablet (40 mg total) by mouth every evening. - Oral  3. Are you having a reaction (difficulty breathing--STAT)? no  4. What is your medication issue? Pt states this medication is making his ears and hand peel and itch. He states he has stopped taking it . Please advise.

## 2021-10-01 NOTE — Telephone Encounter (Signed)
Tikosyn Rx sent

## 2021-10-05 ENCOUNTER — Telehealth: Payer: Self-pay | Admitting: Cardiology

## 2021-10-05 NOTE — Telephone Encounter (Signed)
  Pt c/o medication issue:  1. Name of Medication: ezetimibe (ZETIA) 10 MG tablet  2. How are you currently taking this medication (dosage and times per day)? Take 1 tablet (10 mg total) by mouth daily.  3. Are you having a reaction (difficulty breathing--STAT)?   4. What is your medication issue? Pt said, he started  taking ts meds 3 days ago and his having joint pains. He said, he cant use his right arm anymore due to pain

## 2021-10-16 NOTE — Telephone Encounter (Signed)
Spoke with pt. He stated that he has stopped all of his cholesterol medications at this time. The Pravastatin caused nausea and the Zetia caused his joints to hurt. He is feeling better and having no nausea or joint pain since stopping the medications.

## 2021-12-14 ENCOUNTER — Other Ambulatory Visit: Payer: Self-pay | Admitting: Cardiology

## 2021-12-14 NOTE — Telephone Encounter (Signed)
Pravastatin refills sent to pharmacy

## 2022-04-02 NOTE — Progress Notes (Unsigned)
Cardiology Office Note Date:  04/03/2022  Patient ID:  [name] Sanford, [name] Sanford Oct 08, 1955, MRN [id number] PCP:  [name] Flock, PA-C  Cardiologist:  None Electrophysiologist: [name] Meredith Leeds, MD  Chief Complaint: 6 mon tikosyn follow-up  History of Present Illness: [name] Sanford is a 67 y.o. male with PMH notable for AF, COPD, HTN, T2DM, tobacco abuse; seen today for [name] Meredith Leeds, MD for routine electrophysiology followup.  Initially diagnosed with AF 2021 Last saw Dr. Curt Sanford 08/2021, doing well, activities limited by back pain. Since last being seen in our clinic the patient reports doing very well. Diligent about taking tikosyn 8a and 8p - uses alarm to remember.  No AF burden.  He takes xarelto daily, no bleeding concerns.  He is active in his grandchildrens' lives, helps care for them afterschool.    he denies chest pain, palpitations, dyspnea, PND, orthopnea, nausea, vomiting, dizziness, syncope, edema, weight gain, or early satiety.    Past Medical History:  Diagnosis Date   Abscess of right groin 08/25/2018   Acute exacerbation of CHF (congestive heart failure) ([place]) 05/09/2019   Anxiety and depression    Arthritis    Arthropathy of lumbar facet joint 01/17/2020   Atrial fibrillation (HCC)    Atrial fibrillation (HCC)    Atrial flutter ([place]) 05/18/2019   Back pain    Bilateral thoracic back pain 12/10/2013   Chronic back pain 12/10/2013   COPD (chronic obstructive pulmonary disease) ([place])    Hematuria, gross 06/19/2016   History of colon polyps    Hx of lumbosacral spine surgery 12/31/2016   Low back pain 09/13/2014   Malfunction of spinal cord stimulator ([place]) 04/01/2017   Mechanical complication nervous system device/implant/graft 12/10/2013   Nocturnal hypoxemia 09/03/2019   Pain in right knee 05/13/2019   Paroxysmal atrial fibrillation ([place]) 05/18/2019   Persistent atrial fibrillation ([place]) 06/08/2019   Post laminectomy syndrome 04/01/2017   Precordial pain 07/19/2016    Recurrent UTI 06/04/2016   Right inguinal pain 08/25/2018   Secondary hypercoagulable state ([place]) 05/18/2019   Smoking 07/19/2016   Status post lumbar spinal fusion 09/13/2014   Visit for monitoring Tikosyn therapy 06/08/2019    Past Surgical History:  Procedure Laterality Date   BRAIN SURGERY     COLONOSCOPY  10/28/2016   Colonic polyps status post polypectomy. Internal hemorrhoids (likely etiology of rectal bleeding-no active bleeding currently)   NECK SURGERY     SHOULDER SURGERY     SPINAL CORD STIMULATOR IMPLANT     WRIST SURGERY      Current Outpatient Medications  Medication Instructions   albuterol (PROVENTIL) (2.5 MG/3ML) 0.083% nebulizer solution 3 mLs, Inhalation, Every 4 hours PRN   dofetilide (TIKOSYN) 500 mcg, Oral, 2 times daily   ezetimibe (ZETIA) 10 mg, Oral, Daily   HYDROMORPHONE HCL ER PO 2 mg, Oral, 2 times daily   Linzess 290 mcg, Oral, Daily before breakfast   mometasone (ELOCON) 0.1 % cream 1 application , Topical, As needed   oxycodone (ROXICODONE) 15-30 mg, Oral, Every 4 hours PRN   pravastatin (PRAVACHOL) 40 MG tablet TAKE 1 TABLET(40 MG) BY MOUTH EVERY EVENING   rivaroxaban (XARELTO) 20 mg, Oral, Daily with supper   testosterone cypionate (DEPOTESTOSTERONE CYPIONATE) 200 mg, Intramuscular, Every 14 days      Social History:  The patient  reports that he has been smoking cigarettes. He has a 45.00 pack-year smoking history. He has never used smokeless tobacco. He reports that he does not currently use alcohol. He reports  that he does not use drugs.   Family History:   The patient's family history includes Cancer in his father and mother; Heart disease in his brother and father; Ovarian cancer in his sister.  ROS:  Please see the history of present illness. All other systems are reviewed and otherwise negative.   PHYSICAL EXAM:  VS:  BP 126/80   Pulse 71   Ht 6' (1.829 m)   Wt 209 lb (94.8 kg)   SpO2 92%   BMI 28.35 kg/m  BMI: Body mass index is  28.35 kg/m.  GEN- The patient is well appearing, alert and oriented x 3 today.   HEENT: normocephalic, atraumatic; sclera clear, conjunctiva pink; hearing intact; oropharynx clear; neck supple, no JVP Lungs- Inspiratory wheezing, clears with cough; normal work of breathing.   Heart- Regular rate and rhythm, no murmurs, rubs or gallops, PMI not laterally displaced GI- soft, non-tender, non-distended, bowel sounds present, no hepatosplenomegaly Extremities- No peripheral edema. no clubbing or cyanosis; DP/PT/radial pulses 2+ bilaterally MS- no significant deformity or atrophy Skin- warm and dry, no rash or lesion Psych- euthymic mood, full affect Neuro- strength and sensation are intact   EKG is ordered. Personal review of EKG from today shows:  NSR, rate 75bpm. QT 42m, QTc 4443m Recent Labs: 09/10/2021: ALT 14; BUN 12; Creatinine, Ser 0.95; Hemoglobin 16.1; Magnesium 2.2; Platelets 208; Potassium 4.3; Sodium 138  09/10/2021: Chol/HDL Ratio 2.9; Cholesterol, Total 171; HDL 60; LDL Chol Calc (NIH) 99; Triglycerides 60   CrCl cannot be calculated (Patient's most recent lab result is older than the maximum 21 days allowed.).   Wt Readings from Last 3 Encounters:  04/03/22 209 lb (94.8 kg)  08/20/21 211 lb (95.7 kg)  08/16/21 210 lb 6.4 oz (95.4 kg)     Additional studies reviewed include: Previous EP, cardiology notes.   TTE 05/09/2019 1. Poor acoustic windows limit study.   2. Left ventricular ejection fraction, by estimation, is 55 to 60%. The left ventricle has normal function. The left ventricle has no regional wall motion abnormalities. There is moderate left ventricular hypertrophy. Left ventricular diastolic parameters are indeterminate.   3. Right ventricular systolic function is normal. The right ventricular size is normal.   4. The mitral valve is abnormal. No evidence of mitral valve regurgitation.   5. The aortic valve is normal in structure. Aortic valve regurgitation  is not visualized.   6. The inferior vena cava is dilated in size with <50% respiratory variability, suggesting right atrial pressure of 15 mmHg.    ASSESSMENT AND PLAN:  #) Parox AF Well-controlled on tikosyn 50063mID  CHA2DS2-VASc Score = 2 [CHF History: 0, HTN History: 1, Diabetes History: 1, Stroke History: 0, Vascular Disease History: 0, Age Score: 0, Gender Score: 0].  Therefore, the patient's annual risk of stroke is 2.2 %.    OACGumlogXarelto 55m31mily, appropriately dosed  #) HTN Well-controlled today   Current medicines are reviewed at length with the patient today.   The patient does not have concerns regarding his medicines.  The following changes were made today:  none  Labs/ tests ordered today include:  Orders Placed This Encounter  Procedures   EKG 12-Lead     Disposition: Follow up with Dr. CamnCurt Bearsin 6 months   Signed, SuzaMamie Levers  04/03/22  4:06 PM  Electrophysiology [facility]

## 2022-04-03 ENCOUNTER — Encounter: Payer: Self-pay | Admitting: Physician Assistant

## 2022-04-03 ENCOUNTER — Ambulatory Visit: Payer: Medicare Other | Attending: Physician Assistant | Admitting: Cardiology

## 2022-04-03 VITALS — BP 126/80 | HR 71 | Ht 72.0 in | Wt 209.0 lb

## 2022-04-03 DIAGNOSIS — D6869 Other thrombophilia: Secondary | ICD-10-CM

## 2022-04-03 DIAGNOSIS — I480 Paroxysmal atrial fibrillation: Secondary | ICD-10-CM

## 2022-04-03 DIAGNOSIS — I10 Essential (primary) hypertension: Secondary | ICD-10-CM | POA: Diagnosis not present

## 2022-04-03 DIAGNOSIS — Z5181 Encounter for therapeutic drug level monitoring: Secondary | ICD-10-CM | POA: Diagnosis not present

## 2022-04-03 DIAGNOSIS — Z79899 Other long term (current) drug therapy: Secondary | ICD-10-CM

## 2022-04-03 DIAGNOSIS — I4891 Unspecified atrial fibrillation: Secondary | ICD-10-CM

## 2022-04-03 MED ORDER — RIVAROXABAN 20 MG PO TABS: 20.0000 mg | ORAL_TABLET | Freq: Every day | ORAL | 1 refills | Status: DC

## 2022-04-03 MED ORDER — DOFETILIDE 500 MCG PO CAPS: 500.0000 ug | ORAL_CAPSULE | Freq: Two times a day (BID) | ORAL | 2 refills | Status: DC

## 2022-04-03 NOTE — Patient Instructions (Signed)
Medication Instructions:  .isntcur  *If you need a refill on your cardiac medications before your next appointment, please call your pharmacy*   Lab Work: None ordered   If you have labs (blood work) drawn today and your tests are completely normal, you will receive your results only by: [place] (if you have MyChart) OR A paper copy in the mail If you have any lab test that is abnormal or we need to change your treatment, we will call you to review the results.   Testing/Procedures: None ordered    Follow-Up: At [facility], you and your health needs are our priority.  As part of our continuing mission to provide you with exceptional heart care, we have created designated Provider Care Teams.  These Care Teams include your primary Cardiologist (physician) and Advanced Practice Providers (APPs -  Physician Assistants and Nurse Practitioners) who all work together to provide you with the care you need, when you need it.  We recommend signing up for the patient portal called "MyChart".  Sign up information is provided on this After Visit Summary.  MyChart is used to connect with patients for Virtual Visits (Telemedicine).  Patients are able to view lab/test results, encounter notes, upcoming appointments, etc.  Non-urgent messages can be sent to your provider as well.   To learn more about what you can do with MyChart, go to [email or web address].    Your next appointment:   6 month(s)  Provider:   Will Meredith Leeds, MD   Other Instructions

## 2022-04-18 ENCOUNTER — Encounter: Payer: Self-pay | Admitting: *Deleted

## 2022-04-18 DIAGNOSIS — R918 Other nonspecific abnormal finding of lung field: Secondary | ICD-10-CM

## 2022-04-18 NOTE — Progress Notes (Signed)
Message left with [facility] radiology to obtain disc of images from recent LDCT to be sent via [employer]. Awaiting for call back. Will follow up with pt at new pt visit on 3/4 with Dr. Janese Banks. Nothing further needed at this time.

## 2022-04-19 ENCOUNTER — Ambulatory Visit
Admission: RE | Admit: 2022-04-19 | Discharge: 2022-04-19 | Disposition: A | Discharge status: Home / Self Care | Payer: Self-pay | Source: Ambulatory Visit | Attending: Oncology | Admitting: Oncology

## 2022-04-19 ENCOUNTER — Telehealth: Payer: Self-pay | Admitting: *Deleted

## 2022-04-19 DIAGNOSIS — R918 Other nonspecific abnormal finding of lung field: Secondary | ICD-10-CM

## 2022-04-19 NOTE — Telephone Encounter (Signed)
I called today to introduce myself and let pt know about his date and time for the appt with Dr. Janese Banks . I spoke about directions to the cancer center as well the mask mandatory, and free valet, and 1 visitor only and has ti be be over 16. The patient states that his wife will be there with him and she is more that 70, and then he laughed.Pt. will be there on 3/4

## 2022-04-19 NOTE — Addendum Note (Signed)
Addended by: Telford Nab on: 04/19/2022 03:17 PM   Modules accepted: Orders

## 2022-04-22 ENCOUNTER — Inpatient Hospital Stay: Payer: Medicare Other

## 2022-04-22 ENCOUNTER — Inpatient Hospital Stay: Payer: Medicare Other | Attending: Oncology | Admitting: Oncology

## 2022-04-22 ENCOUNTER — Encounter: Payer: Self-pay | Admitting: Oncology

## 2022-04-22 ENCOUNTER — Encounter: Payer: Self-pay | Admitting: *Deleted

## 2022-04-22 VITALS — BP 99/63 | HR 51 | Temp 97.6°F | Resp 18 | Wt 211.9 lb

## 2022-04-22 DIAGNOSIS — J449 Chronic obstructive pulmonary disease, unspecified: Secondary | ICD-10-CM | POA: Diagnosis not present

## 2022-04-22 DIAGNOSIS — R918 Other nonspecific abnormal finding of lung field: Secondary | ICD-10-CM | POA: Diagnosis not present

## 2022-04-22 NOTE — Progress Notes (Signed)
Hematology/Oncology Consult note [facility] [phone] [phone]  Patient Care Team: Dustin Flock, PA-C as PCP - General (Physician Assistant) Constance Haw, MD as PCP - Electrophysiology (Cardiology) Telford Nab, RN as Oncology Nurse Navigator   Name of the patient: [name] Sanford  IN:[id number]  21-Sep-1955    Reason for referral-lung nodules   Referring physician-Philip Land, [place]  Date of visit: 04/22/22   History of presenting illness-patient is a 67 year old male with a past medical history significant for COPD and longstanding smoking history.  He quit smoking about 2 weeks ago.  HeUnderwent a lower dose screening CT scan for lung cancer which showed multiple bilateral lung nodules with the largest 1 in the right upper lobe measuring 12.6 mm.  Changes of underlying centrilobular and paraseptal emphysema.  These were categorized as LI-RADS 4.  Repeat CT was recommended in 3 months versus a PET scan.  Patient is currently doing well and denies any symptoms of pain or changes in his appetite.  Denies any cough or shortness of breath.  He uses inhalers for COPD.  He is not on any home oxygen.  ECOG PS- 0  Pain scale- 0   Review of systems- Review of Systems  Constitutional:  Negative for chills, fever, malaise/fatigue and weight loss.  HENT:  Negative for congestion, ear discharge and nosebleeds.   Eyes:  Negative for blurred vision.  Respiratory:  Negative for cough, hemoptysis, sputum production, shortness of breath and wheezing.   Cardiovascular:  Negative for chest pain, palpitations, orthopnea and claudication.  Gastrointestinal:  Negative for abdominal pain, blood in stool, constipation, diarrhea, heartburn, melena, nausea and vomiting.  Genitourinary:  Negative for dysuria, flank pain, frequency, hematuria and urgency.  Musculoskeletal:  Negative for back pain, joint pain and myalgias.  Skin:  Negative for rash.   Neurological:  Negative for dizziness, tingling, focal weakness, seizures, weakness and headaches.  Endo/Heme/Allergies:  Does not bruise/bleed easily.  Psychiatric/Behavioral:  Negative for depression and suicidal ideas. The patient does not have insomnia.     Allergies  Allergen Reactions   Oxymorphone Other (See Comments)    Flu like symptoms, oral blisters; Opana   Oxymorphone Hcl     Patient Active Problem List   Diagnosis Date Noted   History of colon polyps 01/02/2021   Anxiety and depression 01/02/2021   DDD (degenerative disc disease), lumbosacral 06/28/2020   S/P insertion of spinal cord stimulator 06/28/2020   Atrial fibrillation (HCC)    Arthritis    Arthropathy of lumbar facet joint 01/17/2020   COPD (chronic obstructive pulmonary disease) ([place]) 09/03/2019   Nocturnal hypoxemia 09/03/2019   Persistent atrial fibrillation ([place]) 06/08/2019   Visit for monitoring Tikosyn therapy 06/08/2019   Paroxysmal atrial fibrillation ([place]) 05/18/2019   Atrial flutter ([place]) 05/18/2019   Secondary hypercoagulable state ([place]) 05/18/2019   Pain in right knee 05/13/2019   Acute exacerbation of CHF (congestive heart failure) ([place]) 05/09/2019   Abscess of right groin 08/25/2018   Right inguinal pain 08/25/2018   Malfunction of spinal cord stimulator ([place]) 04/01/2017   Post laminectomy syndrome 04/01/2017   Dyslipidemia 07/19/2016   Precordial pain 07/19/2016   Smoking 07/19/2016   Hematuria, gross 06/19/2016   Recurrent UTI 06/04/2016   Low back pain 09/13/2014   History of lumbar fusion 09/13/2014   Status post lumbar spinal fusion 09/13/2014   Failed back syndrome 09/13/2014   Bilateral thoracic back pain 12/10/2013   Chronic back pain 12/10/2013   Mechanical complication nervous  system device/implant/graft 12/10/2013     Past Medical History:  Diagnosis Date   Abscess of right groin 08/25/2018   Acute exacerbation of CHF (congestive heart failure) ([place]) 05/09/2019    Anxiety and depression    Arthritis    Arthropathy of lumbar facet joint 01/17/2020   Atrial fibrillation (HCC)    Atrial fibrillation (HCC)    Atrial flutter ([place]) 05/18/2019   Back pain    Bilateral thoracic back pain 12/10/2013   Chronic back pain 12/10/2013   COPD (chronic obstructive pulmonary disease) ([place])    Hematuria, gross 06/19/2016   History of colon polyps    Hx of lumbosacral spine surgery 12/31/2016   Low back pain 09/13/2014   Malfunction of spinal cord stimulator ([place]) 04/01/2017   Mechanical complication nervous system device/implant/graft 12/10/2013   Nocturnal hypoxemia 09/03/2019   Pain in right knee 05/13/2019   Paroxysmal atrial fibrillation ([place]) 05/18/2019   Persistent atrial fibrillation ([place]) 06/08/2019   Post laminectomy syndrome 04/01/2017   Precordial pain 07/19/2016   Recurrent UTI 06/04/2016   Right inguinal pain 08/25/2018   Secondary hypercoagulable state ([place]) 05/18/2019   Smoking 07/19/2016   Status post lumbar spinal fusion 09/13/2014   Visit for monitoring Tikosyn therapy 06/08/2019     Past Surgical History:  Procedure Laterality Date   BRAIN SURGERY     COLONOSCOPY  10/28/2016   Colonic polyps status post polypectomy. Internal hemorrhoids (likely etiology of rectal bleeding-no active bleeding currently)   NECK SURGERY     SHOULDER SURGERY     SPINAL CORD STIMULATOR IMPLANT     WRIST SURGERY      Social History   Socioeconomic History   Marital status: Married    Spouse name: Not on file   Number of children: Not on file   Years of education: Not on file   Highest education level: Not on file  Occupational History   Occupation: Disabled  Tobacco Use   Smoking status: Every Day    Packs/day: 1.00    Years: 45.00    Total pack years: 45.00    Types: Cigarettes   Smokeless tobacco: Never   Tobacco comments:    20-30 cigarettes daily 09/03/19 ARJ   Vaping Use   Vaping Use: Former  Substance and Sexual Activity   Alcohol use: Not Currently    Drug use: Never   Sexual activity: Not on file  Other Topics Concern   Not on file  Social History Narrative   Not on file   Social Determinants of Health   Financial Resource Strain: Not on file  Food Insecurity: No Food Insecurity (04/22/2022)   Hunger Vital Sign    Worried About Running Out of Food in the Last Year: Never true    [place] in the Last Year: Never true  Transportation Needs: No Transportation Needs (04/22/2022)   PRAPARE - Hydrologist (Medical): No    Lack of Transportation (Non-Medical): No  Physical Activity: Not on file  Stress: Not on file  Social Connections: Not on file  Intimate Partner Violence: Not At Risk (04/22/2022)   Humiliation, Afraid, Rape, and Kick questionnaire    Fear of Current or Ex-Partner: No    Emotionally Abused: No    Physically Abused: No    Sexually Abused: No     Family History  Problem Relation Age of Onset   Cancer Mother        Lung   Heart disease  Father    Cancer Father    Ovarian cancer Sister    Heart disease Brother    Colon cancer Neg Hx    Esophageal cancer Neg Hx    Stomach cancer Neg Hx      Current Outpatient Medications:    albuterol (PROVENTIL) (2.5 MG/3ML) 0.083% nebulizer solution, Inhale 3 mLs into the lungs every 4 (four) hours as needed for shortness of breath., Disp: , Rfl:    dofetilide (TIKOSYN) 500 MCG capsule, Take 1 capsule (500 mcg total) by mouth 2 (two) times daily., Disp: 180 capsule, Rfl: 2   HYDROMORPHONE HCL ER PO, Take 2 mg by mouth 2 (two) times daily., Disp: , Rfl:    LINZESS 290 MCG CAPS capsule, Take 290 mcg by mouth daily before breakfast. , Disp: , Rfl:    mometasone (ELOCON) 0.1 % cream, Apply 1 application topically as needed (rosacea). , Disp: , Rfl:    oxycodone (ROXICODONE) 30 MG immediate release tablet, Take 15-30 mg by mouth every 4 (four) hours as needed for pain., Disp: , Rfl:    pravastatin (PRAVACHOL) 40 MG tablet, TAKE 1 TABLET(40 MG)  BY MOUTH EVERY EVENING, Disp: 30 tablet, Rfl: 2   rivaroxaban (XARELTO) 20 MG TABS tablet, Take 1 tablet (20 mg total) by mouth daily with supper., Disp: 90 tablet, Rfl: 1   testosterone cypionate (DEPOTESTOSTERONE CYPIONATE) 200 MG/ML injection, Inject 200 mg into the muscle every 14 (fourteen) days., Disp: , Rfl:    ezetimibe (ZETIA) 10 MG tablet, Take 1 tablet (10 mg total) by mouth daily. (Patient not taking: Reported on 04/22/2022), Disp: 90 tablet, Rfl: 3   Physical exam:  Vitals:   04/22/22 1328 04/22/22 1333  BP: 98/63 99/63  Pulse: (!) 51   Resp: 18   Temp: 97.6 F (36.4 C)   TempSrc: Tympanic   SpO2: 96%   Weight: 211 lb 14.4 oz (96.1 kg)    Physical Exam Cardiovascular:     Rate and Rhythm: Normal rate and regular rhythm.     Heart sounds: Normal heart sounds.  Pulmonary:     Effort: Pulmonary effort is normal.     Breath sounds: Normal breath sounds.  Abdominal:     General: Bowel sounds are normal.     Palpations: Abdomen is soft.  Lymphadenopathy:     Comments: No palpable supraclavicular or cervical adenopathy  Skin:    General: Skin is warm and dry.  Neurological:     Mental Status: He is alert and oriented to person, place, and time.           Latest Ref Rng & Units 09/10/2021   10:13 AM  CMP  Glucose 70 - 99 mg/dL 101   BUN 8 - 27 mg/dL 12   Creatinine 0.76 - 1.27 mg/dL 0.95   Sodium 134 - 144 mmol/L 138   Potassium 3.5 - 5.2 mmol/L 4.3   Chloride 96 - 106 mmol/L 98   CO2 20 - 29 mmol/L 29   Calcium 8.6 - 10.2 mg/dL 9.3       Latest Ref Rng & Units 09/10/2021   10:13 AM  CBC  WBC 3.4 - 10.8 x10E3/uL 7.3   Hemoglobin 13.0 - 17.7 g/dL 16.1   Hematocrit 37.5 - 51.0 % 46.6   Platelets 150 - 450 x10E3/uL 208     Assessment and plan- Patient is a 67 y.o. male referred for bilateral lung nodules  I have reviewed CT chest images independently and discussed findings with  the patient which shows mostly bilateral subcentimeter lung nodules.  There  was concern in the right upper lobe lung nodule which measured 12.5 mm but did not have a spiculated appearance.  I plan to discuss this case at tumor board this week to see if we should proceed with a PET CT scan or continue to observe his lung nodules with a repeat CT in 3 months.  We will communicate consensus from the tumor board to the patient is follow-up with me to be scheduled accordingly.  Thank you for this kind referral and the opportunity to participate in the care of this patient   Visit Diagnosis 1. Lung nodules     Dr. Randa Evens, MD, MPH [facility] [id number] 04/22/2022

## 2022-04-22 NOTE — Progress Notes (Signed)
Per Dr. Janese Banks, pt needs to be presented at tumor board this Thursday and be called with recommendations after discussion. Discussion to include if pt needs PET scan at this time or short term follow up scan in 3 months. Will notify pt with recommendations after tumor board on Thursday. Will continue to follow. Nothing further needed at this time.

## 2022-04-25 ENCOUNTER — Encounter: Payer: Self-pay | Admitting: *Deleted

## 2022-04-25 ENCOUNTER — Other Ambulatory Visit: Payer: Medicare Other

## 2022-04-25 NOTE — Progress Notes (Signed)
Spoke with patient to inform that his case was reviewed today at tumor board and recommendations received were to proceed with follow up CT scan in 3 months. Informed pt that we can place the order and have him follow up with Dr. Janese Banks after his scan in 3 months. Pt requested that his PCP order the scan and follow up with him since he lives in [place]. Informed pt that if he is welcome to follow up with [place] if needed in the future. Will fax records and recommendations to pt's PCP at [facility] in [place]. Advised pt to call back if unable to schedule CT scan in 3 months. Pt verbalized understanding. Nothing further needed at this time.

## 2022-06-05 ENCOUNTER — Inpatient Hospital Stay: Payer: Medicare Other

## 2022-06-14 ENCOUNTER — Encounter: Payer: Self-pay | Admitting: Cardiology

## 2022-06-14 ENCOUNTER — Ambulatory Visit: Payer: Medicare Other | Attending: Cardiology | Admitting: Cardiology

## 2022-06-14 VITALS — BP 126/70 | HR 56 | Ht 72.0 in | Wt 215.4 lb

## 2022-06-14 DIAGNOSIS — E785 Hyperlipidemia, unspecified: Secondary | ICD-10-CM

## 2022-06-14 DIAGNOSIS — I480 Paroxysmal atrial fibrillation: Secondary | ICD-10-CM | POA: Diagnosis not present

## 2022-06-14 DIAGNOSIS — J449 Chronic obstructive pulmonary disease, unspecified: Secondary | ICD-10-CM | POA: Diagnosis not present

## 2022-06-14 DIAGNOSIS — R0609 Other forms of dyspnea: Secondary | ICD-10-CM | POA: Diagnosis not present

## 2022-06-14 DIAGNOSIS — M5440 Lumbago with sciatica, unspecified side: Secondary | ICD-10-CM

## 2022-06-14 NOTE — Patient Instructions (Signed)

## 2022-06-14 NOTE — Progress Notes (Signed)
Cardiology Office Note:    Date:  06/14/2022   ID:  [name] Sanford, DOB [birth year], MRN [id number]  PCP:  Alinda Deem, MD  Cardiologist:  Gypsy Balsam, MD    Referring MD: Treasa School, [place]   Chief Complaint  Patient presents with   Follow-up    History of Present Illness:    [name] Sanford is a 67 y.o. male past medical history significant for essential hypertension, diabetes, paroxysmal atrial fibrillation, he is anticoagulant Xarelto and suppressed with Tikosyn, smoking, obesity.  Overall doing fair he complained of having back pain which is a chronic issue he gets an injury years ago have some surgery done years ago and now it started acting up.  He also was diagnosed with some pulmonary nodules he is followed by oncology for it.  In the note he said that he quit smoking but sadly he still continues to smoke.  Denies having any palpitations no chest pain tightness squeezing pressure burning chest, shortness of breath and cough is still there.  Past Medical History:  Diagnosis Date   Abscess of right groin 08/25/2018   Acute exacerbation of CHF (congestive heart failure) (HCC) 05/09/2019   Anterior to posterior tear of superior glenoid labrum of left shoulder 07/12/2021   Anxiety and depression    Arthritis    Arthropathy of lumbar facet joint 01/17/2020   Atrial fibrillation (HCC)    Atrial fibrillation (HCC)    Atrial flutter (HCC) 05/18/2019   Back pain    Bilateral thoracic back pain 12/10/2013   Chronic back pain 12/10/2013   COPD (chronic obstructive pulmonary disease) (HCC)    Hematuria, gross 06/19/2016   History of colon polyps    Hx of lumbosacral spine surgery 12/31/2016   Low back pain 09/13/2014   Malfunction of spinal cord stimulator (HCC) 04/01/2017   Mechanical complication nervous system device/implant/graft 12/10/2013   Nocturnal hypoxemia 09/03/2019   Pain in right knee 05/13/2019   Paroxysmal atrial fibrillation (HCC) 05/18/2019   Persistent  atrial fibrillation (HCC) 06/08/2019   Post laminectomy syndrome 04/01/2017   Precordial pain 07/19/2016   Recurrent UTI 06/04/2016   Right inguinal pain 08/25/2018   Secondary hypercoagulable state (HCC) 05/18/2019   Smoking 07/19/2016   Status post lumbar spinal fusion 09/13/2014   Visit for monitoring Tikosyn therapy 06/08/2019    Past Surgical History:  Procedure Laterality Date   BRAIN SURGERY     COLONOSCOPY  10/28/2016   Colonic polyps status post polypectomy. Internal hemorrhoids (likely etiology of rectal bleeding-no active bleeding currently)   NECK SURGERY     SHOULDER SURGERY     SPINAL CORD STIMULATOR IMPLANT     WRIST SURGERY      Current Medications: Current Meds  Medication Sig   albuterol (PROVENTIL) (2.5 MG/3ML) 0.083% nebulizer solution Inhale 3 mLs into the lungs every 4 (four) hours as needed for shortness of breath.   Budeson-Glycopyrrol-Formoterol (BREZTRI AEROSPHERE) 160-9-4.8 MCG/ACT AERO Inhale 2 puffs into the lungs 2 (two) times daily.   dofetilide (TIKOSYN) 500 MCG capsule Take 1 capsule (500 mcg total) by mouth 2 (two) times daily.   ezetimibe (ZETIA) 10 MG tablet Take 1 tablet (10 mg total) by mouth daily.   HYDROMORPHONE HCL ER PO Take 2 mg by mouth 2 (two) times daily.   LINZESS 290 MCG CAPS capsule Take 290 mcg by mouth daily before breakfast.    mometasone (ELOCON) 0.1 % cream Apply 1 application topically as needed (rosacea).    oxycodone (ROXICODONE) 30 MG  immediate release tablet Take 15-30 mg by mouth every 4 (four) hours as needed for pain.   pravastatin (PRAVACHOL) 40 MG tablet TAKE 1 TABLET(40 MG) BY MOUTH EVERY EVENING (Patient taking differently: Take 40 mg by mouth daily.)   rivaroxaban (XARELTO) 20 MG TABS tablet Take 1 tablet (20 mg total) by mouth daily with supper.   testosterone cypionate (DEPOTESTOSTERONE CYPIONATE) 200 MG/ML injection Inject 200 mg into the muscle every 14 (fourteen) days.     Allergies:   Oxymorphone and  Oxymorphone hcl   Social History   Socioeconomic History   Marital status: Married    Spouse name: Not on file   Number of children: Not on file   Years of education: Not on file   Highest education level: Not on file  Occupational History   Occupation: Disabled  Tobacco Use   Smoking status: Every Day    Packs/day: 1.00    Years: 45.00    Additional pack years: 0.00    Total pack years: 45.00    Types: Cigarettes   Smokeless tobacco: Never   Tobacco comments:    20-30 cigarettes daily 09/03/19 ARJ   Vaping Use   Vaping Use: Former  Substance and Sexual Activity   Alcohol use: Not Currently   Drug use: Never   Sexual activity: Not on file  Other Topics Concern   Not on file  Social History Narrative   Not on file   Social Determinants of Health   Financial Resource Strain: Not on file  Food Insecurity: No Food Insecurity (04/22/2022)   Hunger Vital Sign    Worried About Running Out of Food in the Last Year: Never true    Ran Out of Food in the Last Year: Never true  Transportation Needs: No Transportation Needs (04/22/2022)   PRAPARE - Administrator, Civil Service (Medical): No    Lack of Transportation (Non-Medical): No  Physical Activity: Not on file  Stress: Not on file  Social Connections: Not on file     Family History: The patient's family history includes Cancer in his father and mother; Heart disease in his brother and father; Ovarian cancer in his sister. There is no history of Colon cancer, Esophageal cancer, or Stomach cancer. ROS:   Please see the history of present illness.    All 14 point review of systems negative except as described per history of present illness  EKGs/Labs/Other Studies Reviewed:      Recent Labs: 09/10/2021: ALT 14; BUN 12; Creatinine, Ser 0.95; Hemoglobin 16.1; Magnesium 2.2; Platelets 208; Potassium 4.3; Sodium 138  Recent Lipid Panel    Component Value Date/Time   CHOL 171 09/10/2021 1012   TRIG 60 09/10/2021  1012   HDL 60 09/10/2021 1012   CHOLHDL 2.9 09/10/2021 1012   CHOLHDL 4.1 05/09/2019 1537   VLDL 13 05/09/2019 1537   LDLCALC 99 09/10/2021 1012    Physical Exam:    VS:  BP 126/70 (BP Location: Left Arm, Patient Position: Sitting)   Pulse (!) 56   Ht 6' (1.829 m)   Wt 215 lb 6.4 oz (97.7 kg)   SpO2 96%   BMI 29.21 kg/m     Wt Readings from Last 3 Encounters:  06/14/22 215 lb 6.4 oz (97.7 kg)  04/22/22 211 lb 14.4 oz (96.1 kg)  04/03/22 209 lb (94.8 kg)     GEN:  Well nourished, well developed in no acute distress HEENT: Normal NECK: No JVD; No carotid bruits LYMPHATICS:  No lymphadenopathy CARDIAC: RRR, no murmurs, no rubs, no gallops RESPIRATORY:  Clear to auscultation without rales, wheezing or rhonchi  ABDOMEN: Soft, non-tender, non-distended MUSCULOSKELETAL:  No edema; No deformity  SKIN: Warm and dry LOWER EXTREMITIES: no swelling NEUROLOGIC:  Alert and oriented x 3 PSYCHIATRIC:  Normal affect   ASSESSMENT:    1. Dyspnea on exertion   2. Paroxysmal atrial fibrillation (HCC)   3. Chronic obstructive pulmonary disease, unspecified COPD type (HCC)   4. Dyslipidemia   5. Low back pain with sciatica, sciatica laterality unspecified, unspecified back pain laterality, unspecified chronicity    PLAN:    In order of problems listed above:  Dyspnea on exertion multifactorial will schedule him to have echocardiogram done. Paroxysmal atrial fibrillation maintained sinus rhythm we use a rhythm control strategy he is taking Tikosyn as well as Xarelto. COPD obviously huge problem he understand he needs to quit and he is trying to work on that. Low back pain that being followed by primary care physician. Dyslipidemia I did review his K PN which show me his LDL of 99 HDL 47 continue present management I have    Medication Adjustments/Labs and Tests Ordered: Current medicines are reviewed at length with the patient today.  Concerns regarding medicines are outlined above.   Orders Placed This Encounter  Procedures   ECHOCARDIOGRAM COMPLETE   Medication changes: No orders of the defined types were placed in this encounter.   Signed, Georgeanna Lea, MD, [facility] 06/14/2022 9:42 AM    [facility]

## 2022-06-19 ENCOUNTER — Inpatient Hospital Stay: Payer: Medicare Other | Attending: Oncology | Admitting: Hospice and Palliative Medicine

## 2022-06-19 DIAGNOSIS — R918 Other nonspecific abnormal finding of lung field: Secondary | ICD-10-CM

## 2022-06-19 NOTE — Progress Notes (Signed)
Multidisciplinary Oncology Council Documentation  [name] Sanford was presented by our [facility] on 06/19/2022, which included representatives from:  Palliative Care Dietitian  Physical/Occupational Therapist Nurse Navigator Genetics Speech Therapist Social work Survivorship RN Financial Navigator Research RN   [name] Sanford currently presents with history of lung mass  We reviewed previous medical and familial history, history of present illness, and recent lab results along with all available histopathologic and imaging studies. The MOC considered available treatment options and made the following recommendations/referrals:  None   The MOC is a meeting of clinicians from various specialty areas who evaluate and discuss patients for whom a multidisciplinary approach is being considered. Final determinations in the plan of care are those of the provider(s).   Today's extended care, comprehensive team conference, [name] Sanford was not present for the discussion and was not examined.

## 2022-06-26 DIAGNOSIS — M25562 Pain in left knee: Secondary | ICD-10-CM | POA: Insufficient documentation

## 2022-06-27 DIAGNOSIS — M179 Osteoarthritis of knee, unspecified: Secondary | ICD-10-CM | POA: Insufficient documentation

## 2022-06-27 DIAGNOSIS — M25561 Pain in right knee: Secondary | ICD-10-CM | POA: Insufficient documentation

## 2022-06-28 ENCOUNTER — Ambulatory Visit: Payer: Medicare Other | Attending: Cardiology

## 2022-06-28 DIAGNOSIS — R0609 Other forms of dyspnea: Secondary | ICD-10-CM

## 2022-06-28 LAB — ECHOCARDIOGRAM COMPLETE
Area-P 1/2: 3.73 cm2
S' Lateral: 3.8 cm

## 2022-07-03 ENCOUNTER — Telehealth: Payer: Self-pay | Admitting: Cardiology

## 2022-07-03 NOTE — Telephone Encounter (Signed)
Patient informed of results.  

## 2022-07-03 NOTE — Telephone Encounter (Signed)
Pt returning call for echo results  

## 2022-09-13 ENCOUNTER — Telehealth: Payer: Self-pay | Admitting: Cardiology

## 2022-09-13 MED ORDER — RIVAROXABAN 20 MG PO TABS: 20.0000 mg | ORAL_TABLET | Freq: Every day | ORAL | Status: DC

## 2022-09-13 NOTE — Telephone Encounter (Signed)
XARELTO withMe Coverage Gap Support (formerly Sales promotion account executive)  Insurance started charging you more for [employer]? We've got you covered.  Order Carlena Hurl directly: $16* per 30-day supply

## 2022-09-13 NOTE — Telephone Encounter (Signed)
Pt c/o medication issue:  1. Name of Medication:   rivaroxaban (XARELTO) 20 MG TABS tablet    2. How are you currently taking this medication (dosage and times per day)?   Take 1 tablet (20 mg total) by mouth daily with supper.    3. Are you having a reaction (difficulty breathing--STAT)? No  4. What is your medication issue? Pt states that he is not able to afford medication and would like to know if something else is able to be prescribed. Please advise

## 2022-09-30 ENCOUNTER — Telehealth: Payer: Self-pay | Admitting: Cardiology

## 2022-09-30 MED ORDER — RIVAROXABAN 20 MG PO TABS: 20.0000 mg | ORAL_TABLET | Freq: Every day | ORAL | 0 refills | Status: DC

## 2022-09-30 NOTE — Telephone Encounter (Signed)
Patient calling the office for samples of medication:   1.  What medication and dosage are you requesting samples for? rivaroxaban (XARELTO) 20 MG TABS tablet  2.  Are you currently out of this medication? No   

## 2022-09-30 NOTE — Telephone Encounter (Signed)
Samples placed for pt to pickup. Pt states that he has the completed pt assistance paperwork and will bring to the office today. Pt states that he has called Zarelto and me and they cannot enroll him online.

## 2022-10-02 ENCOUNTER — Telehealth: Payer: Self-pay

## 2022-10-02 NOTE — Telephone Encounter (Signed)
Assistance application for Xarelto completed and faxed

## 2022-10-04 ENCOUNTER — Telehealth: Payer: Self-pay

## 2022-10-04 NOTE — Telephone Encounter (Signed)
Per [employer] patient needs to provide a copy of proof of income to process his request. I called and LM.

## 2022-10-15 ENCOUNTER — Telehealth: Payer: Self-pay

## 2022-10-15 NOTE — Telephone Encounter (Signed)
Refax Xarelto application with proof of income.

## 2022-10-22 ENCOUNTER — Telehealth: Payer: Self-pay

## 2022-10-22 NOTE — Telephone Encounter (Signed)
Spoke to Rowley on a recorded line, she stated application received and I was put on hold because there team claim income verification was needed. She found that the application was completed and proof of income was attach. She has expedite the request since it's been in there system for quit some time. She said I should have a determination within 24-48 hours. She also she the patient income meet the requirements.

## 2022-10-25 ENCOUNTER — Telehealth: Payer: Self-pay

## 2022-10-25 NOTE — Telephone Encounter (Signed)
I spoke with [name] Sanford assistance rep, informed patient does not qualify for the program because patient insurance provides coverage. There is another alternative assistance call Gap coverage where if the patient copay exceed 89 dollars then the patient call [name] and apply over the phone. I called and a detailed message and provided [place] telephone number.

## 2022-11-20 ENCOUNTER — Other Ambulatory Visit: Payer: Self-pay | Admitting: Cardiology

## 2022-11-20 DIAGNOSIS — I480 Paroxysmal atrial fibrillation: Secondary | ICD-10-CM

## 2022-11-21 NOTE — Telephone Encounter (Signed)
Refill request

## 2022-11-21 NOTE — Telephone Encounter (Signed)
Prescription refill request for Xarelto received.  Indication: Afib  Last office visit: 04/03/22 Clare Gandy)  Weight: 97.7kg Age: 67 Scr:1.06 (09/06/22)  CrCl: 96.26ml/min  Appropriate dose. Refill sent.

## 2022-12-18 ENCOUNTER — Other Ambulatory Visit: Payer: Self-pay | Admitting: Cardiology

## 2022-12-19 NOTE — Telephone Encounter (Signed)
Refill Request.  

## 2023-02-20 ENCOUNTER — Other Ambulatory Visit: Payer: Self-pay | Admitting: Cardiology

## 2023-02-27 ENCOUNTER — Telehealth: Payer: Self-pay | Admitting: Cardiology

## 2023-02-27 NOTE — Telephone Encounter (Signed)
 Pt c/o medication issue:  1. Name of Medication: XARELTO  20 MG TABS tablet   2. How are you currently taking this medication (dosage and times per day)?    3. Are you having a reaction (difficulty breathing--STAT)? no  4. What is your medication issue? Patient states the medication is too expensive. Calling to see what other options there are. Please advise

## 2023-03-03 NOTE — Telephone Encounter (Signed)
 Spoke with the patient, advised of the following per Dr. Krasowski. Verbalized Eliquis is to expensive. I advise to call his insurance and see if their our programs with his plan he can enroll with or if there's an alternative meds that insurance cover that he can afford. Patient will call back with details.

## 2023-06-02 ENCOUNTER — Other Ambulatory Visit: Payer: Self-pay | Admitting: Cardiology

## 2023-06-04 ENCOUNTER — Other Ambulatory Visit: Payer: Self-pay | Admitting: Cardiology

## 2023-06-04 DIAGNOSIS — I480 Paroxysmal atrial fibrillation: Secondary | ICD-10-CM

## 2023-06-04 NOTE — Telephone Encounter (Addendum)
 Xarelto 20mg  refill request received. Pt is 68 years old, weight-97.7kg, Crea-0.95 on 09/10/21-needs labs, last seen by Dr. Gordan Latina on 06/14/22, Diagnosis-Afib, CrCl-107.13 mL/min; Dose is appropriate based on dosing criteria.   Spoke with the nurse from [facility] and she will fax the labs over. Will await.

## 2023-06-04 NOTE — Telephone Encounter (Signed)
 Labs received from PCP. Scr 1.06 on 04/17/23.  CrCl: 96.01ml/min  Xarelto 20mg  is appropriate dose. Refill sent.

## 2023-06-25 ENCOUNTER — Ambulatory Visit: Attending: Cardiology | Admitting: Cardiology

## 2023-06-25 ENCOUNTER — Encounter: Payer: Self-pay | Admitting: Cardiology

## 2023-06-25 VITALS — BP 102/60 | HR 68 | Ht 72.0 in | Wt 205.8 lb

## 2023-06-25 DIAGNOSIS — J449 Chronic obstructive pulmonary disease, unspecified: Secondary | ICD-10-CM | POA: Diagnosis not present

## 2023-06-25 DIAGNOSIS — E785 Hyperlipidemia, unspecified: Secondary | ICD-10-CM | POA: Diagnosis not present

## 2023-06-25 DIAGNOSIS — I480 Paroxysmal atrial fibrillation: Secondary | ICD-10-CM

## 2023-06-25 DIAGNOSIS — I483 Typical atrial flutter: Secondary | ICD-10-CM

## 2023-06-25 NOTE — Patient Instructions (Signed)

## 2023-06-25 NOTE — Progress Notes (Signed)
 Cardiology Office Note:    Date:  06/25/2023   ID:  Wynetta Heckle, DOB Aug 16, 1955, MRN [id number]  PCP:  Orin Birk, MD  Cardiologist:  Ralene Burger, MD    Referring MD: Orin Birk, MD   Chief Complaint  Patient presents with   Follow-up    History of Present Illness:    [name] Sanford is a 68 y.o. male past medical history significant for essential hypertension, diabetes, paroxysmal atrial fibrillation rhythm control strategy with Tikosyn  as well as anticoagulation on Xarelto , smoking.  Comes today to my office feeling great asymptomatic surprisingly his EKG showed atrial flutter with controlled ventricular rate.  He is disappointed about it but overall she is doing fine denies have any chest pain tightness squeezing pressure burning chest the biggest complaint is chronic back pain.  Past Medical History:  Diagnosis Date   Abscess of right groin 08/25/2018   Acute exacerbation of CHF (congestive heart failure) (HCC) 05/09/2019   Anterior to posterior tear of superior glenoid labrum of left shoulder 07/12/2021   Anxiety and depression    Arthritis    Arthropathy of lumbar facet joint 01/17/2020   Atrial fibrillation (HCC)    Atrial fibrillation (HCC)    Atrial flutter (HCC) 05/18/2019   Back pain    Bilateral thoracic back pain 12/10/2013   Chronic back pain 12/10/2013   COPD (chronic obstructive pulmonary disease) (HCC)    Hematuria, gross 06/19/2016   History of colon polyps    Hx of lumbosacral spine surgery 12/31/2016   Low back pain 09/13/2014   Malfunction of spinal cord stimulator (HCC) 04/01/2017   Mechanical complication nervous system device/implant/graft 12/10/2013   Nocturnal hypoxemia 09/03/2019   Pain in right knee 05/13/2019   Paroxysmal atrial fibrillation (HCC) 05/18/2019   Persistent atrial fibrillation (HCC) 06/08/2019   Post laminectomy syndrome 04/01/2017   Precordial pain 07/19/2016   Recurrent UTI 06/04/2016   Right inguinal pain  08/25/2018   Secondary hypercoagulable state (HCC) 05/18/2019   Smoking 07/19/2016   Status post lumbar spinal fusion 09/13/2014   Visit for monitoring Tikosyn  therapy 06/08/2019    Past Surgical History:  Procedure Laterality Date   BRAIN SURGERY     COLONOSCOPY  10/28/2016   Colonic polyps status post polypectomy. Internal hemorrhoids (likely etiology of rectal bleeding-no active bleeding currently)   NECK SURGERY     SHOULDER SURGERY     SPINAL CORD STIMULATOR IMPLANT     WRIST SURGERY      Current Medications: Current Meds  Medication Sig   albuterol  (PROVENTIL ) (2.5 MG/3ML) 0.083% nebulizer solution Inhale 3 mLs into the lungs every 4 (four) hours as needed for shortness of breath.   Budeson-Glycopyrrol-Formoterol  (BREZTRI AEROSPHERE) 160-9-4.8 MCG/ACT AERO Inhale 2 puffs into the lungs 2 (two) times daily.   diclofenac (VOLTAREN) 75 MG EC tablet Take 75 mg by mouth 2 (two) times daily.   dofetilide  (TIKOSYN ) 500 MCG capsule TAKE 1 CAPSULE(500 MCG) BY MOUTH TWICE DAILY (Patient taking differently: Take 500 mcg by mouth 2 (two) times daily.)   ezetimibe  (ZETIA ) 10 MG tablet Take 1 tablet (10 mg total) by mouth daily.   HYDROMORPHONE  HCL ER PO Take 2 mg by mouth 2 (two) times daily.   LINZESS  290 MCG CAPS capsule Take 290 mcg by mouth daily before breakfast.    mometasone  (ELOCON ) 0.1 % cream Apply 1 application topically as needed (rosacea).    oxycodone  (ROXICODONE ) 30 MG immediate release tablet Take 15-30 mg by mouth every 4 (four) hours  as needed for pain.   pravastatin  (PRAVACHOL ) 40 MG tablet TAKE 1 TABLET(40 MG) BY MOUTH EVERY EVENING (Patient taking differently: Take 40 mg by mouth daily.)   rivaroxaban  (XARELTO ) 20 MG TABS tablet Take 1 tablet (20 mg total) by mouth daily with supper.   tadalafil (CIALIS) 5 MG tablet Take 5 mg by mouth daily.   tamsulosin (FLOMAX) 0.4 MG CAPS capsule Take 0.4 mg by mouth daily after supper.   testosterone  cypionate (DEPOTESTOSTERONE  CYPIONATE) 200 MG/ML injection Inject 200 mg into the muscle every 14 (fourteen) days.   XARELTO  20 MG TABS tablet TAKE 1 TABLET(20 MG) BY MOUTH DAILY WITH SUPPER (Patient taking differently: Take 20 mg by mouth daily with supper.)     Allergies:   Oxymorphone and Oxymorphone hcl   Social History   Socioeconomic History   Marital status: Married    Spouse name: Not on file   Number of children: Not on file   Years of education: Not on file   Highest education level: Not on file  Occupational History   Occupation: Disabled  Tobacco Use   Smoking status: Every Day    Current packs/day: 1.00    Average packs/day: 1 pack/day for 45.0 years (45.0 ttl pk-yrs)    Types: Cigarettes   Smokeless tobacco: Never   Tobacco comments:    20-30 cigarettes daily 09/03/19 [facility]   Vaping Use   Vaping status: Former  Substance and Sexual Activity   Alcohol  use: Not Currently   Drug use: Never   Sexual activity: Not on file  Other Topics Concern   Not on file  Social History Narrative   Not on file   Social Drivers of Health   Financial Resource Strain: Not on file  Food Insecurity: No Food Insecurity (04/22/2022)   Hunger Vital Sign    Worried About Running Out of Food in the Last Year: Never true    Ran Out of Food in the Last Year: Never true  Transportation Needs: No Transportation Needs (04/22/2022)   PRAPARE - Administrator, Civil Service (Medical): No    Lack of Transportation (Non-Medical): No  Physical Activity: Not on file  Stress: Not on file  Social Connections: Unknown (06/23/2021)   Received from [employer], [facility]   Social Network    Social Network: Not on file     Family History: The patient's family history includes Cancer in his father and mother; Heart disease in his brother and father; Ovarian cancer in his sister. There is no history of Colon cancer, Esophageal cancer, or Stomach cancer. ROS:   Please see the history of present illness.    All 14  point review of systems negative except as described per history of present illness  EKGs/Labs/Other Studies Reviewed:    EKG Interpretation Date/Time:  Wednesday Jun 25 2023 09:24:07 EDT Ventricular Rate:  72 PR Interval:    QRS Duration:  82 QT Interval:  412 QTC Calculation: 451 R Axis:   -8  Text Interpretation: Atrial fibrillation Abnormal ECG When compared with ECG of 11-Nov-2019 08:42, Atrial fibrillation has replaced Sinus rhythm Questionable change in QRS axis Confirmed by Ralene Burger [phone]) on 06/25/2023 9:43:58 AM    Recent Labs: No results found for requested labs within last 365 days.  Recent Lipid Panel    Component Value Date/Time   CHOL 171 09/10/2021 1012   TRIG 60 09/10/2021 1012   HDL 60 09/10/2021 1012   CHOLHDL 2.9 09/10/2021 1012  CHOLHDL 4.1 05/09/2019 1537   VLDL 13 05/09/2019 1537   LDLCALC 99 09/10/2021 1012    Physical Exam:    VS:  BP 102/60 (BP Location: Right Arm, Patient Position: Sitting)   Pulse 68   Ht 6' (1.829 m)   Wt 205 lb 12.8 oz (93.4 kg)   SpO2 91%   BMI 27.91 kg/m     Wt Readings from Last 3 Encounters:  06/25/23 205 lb 12.8 oz (93.4 kg)  06/14/22 215 lb 6.4 oz (97.7 kg)  04/22/22 211 lb 14.4 oz (96.1 kg)     GEN:  Well nourished, well developed in no acute distress HEENT: Normal NECK: No JVD; No carotid bruits LYMPHATICS: No lymphadenopathy CARDIAC: RRR, no murmurs, no rubs, no gallops RESPIRATORY:  Clear to auscultation without rales, wheezing or rhonchi  ABDOMEN: Soft, non-tender, non-distended MUSCULOSKELETAL:  No edema; No deformity  SKIN: Warm and dry LOWER EXTREMITIES: no swelling NEUROLOGIC:  Alert and oriented x 3 PSYCHIATRIC:  Normal affect   ASSESSMENT:    1. Paroxysmal atrial fibrillation (HCC)   2. Typical atrial flutter (HCC)   3. Chronic obstructive pulmonary disease, unspecified COPD type (HCC)   4. Dyslipidemia    PLAN:    In order of problems listed above:  Atrial flutter.  He is  anticoagulated rate is controlled so there is no emergency, I will ask our EP colleagues to see him sooner to make a decision either cardioversion versus potentially atrial flutter/atrial fibrillation ablation.  Again likely he is completely asymptomatic. Paroxysmal atrial fibrillation today is in flutter. COPD.  Stable. Dyslipidemia continue K PN show me data from February 27 this year total cholesterol 139 HDL 58.  Will continue present management   Medication Adjustments/Labs and Tests Ordered: Current medicines are reviewed at length with the patient today.  Concerns regarding medicines are outlined above.  Orders Placed This Encounter  Procedures   EKG 12-Lead   Medication changes: No orders of the defined types were placed in this encounter.   Signed, Manfred Seed, MD, [facility] 06/25/2023 9:51 AM    [facility]

## 2023-07-06 NOTE — H&P (View-Only) (Signed)
  Electrophysiology Office Note:   Date:  07/07/2023  ID:  Wynetta Heckle, DOB [birth year], MRN [id number]  Primary Cardiologist: Ralene Burger, MD Primary Heart Failure: None Electrophysiologist: Kent Braunschweig Cortland Ding, MD      History of Present Illness:   [name] Sanford is a 68 y.o. male with h/o COPD, hypertension, diabetes, tobacco abuse, diastolic heart failure, atrial fibrillation seen today for routine electrophysiology followup.   Recently seen by his primary cardiologist who noted him to be in atrial flutter.  He is on dofetilide .  Since last being seen in our clinic the patient reports doing overall well.  He was found to be in atrial flutter at his most recent clinic visit.  He does note some potential increased fatigue and shortness of breath.  Despite this, he is able to do all of his daily activities without any restriction.  He continues to be quite active.  he denies chest pain, palpitations, PND, orthopnea, nausea, vomiting, dizziness, syncope, edema, weight gain, or early satiety.   Review of systems complete and found to be negative unless listed in HPI.   EP Information / Studies Reviewed:    EKG is not ordered today. EKG from 06/25/23 reviewed which showed atrial fibrillation        Risk Assessment/Calculations:    CHA2DS2-VASc Score =     This indicates a  % annual risk of stroke. The patient's score is based upon:             Physical Exam:   VS:  BP (!) 110/52   Pulse 80   Ht 6' (1.829 m)   Wt 212 lb 9.6 oz (96.4 kg)   SpO2 93%   BMI 28.83 kg/m    Wt Readings from Last 3 Encounters:  07/07/23 212 lb 9.6 oz (96.4 kg)  06/25/23 205 lb 12.8 oz (93.4 kg)  06/14/22 215 lb 6.4 oz (97.7 kg)     GEN: Well nourished, well developed in no acute distress NECK: No JVD; No carotid bruits CARDIAC: Irregularly irregular rate and rhythm, no murmurs, rubs, gallops RESPIRATORY:  Clear to auscultation without rales, wheezing or rhonchi  ABDOMEN: Soft, non-tender,  non-distended EXTREMITIES:  No edema; No deformity   ASSESSMENT AND PLAN:    1.  Persistent atrial fibrillation/flutter: On dofetilide .  He is unfortunately back in atrial fibrillation.  He feels somewhat fatigued and short of breath.  He would prefer rhythm control.  At this point, he is not quite yet ready for ablation.  Skarlett Sedlacek plan for cardioversion.  2.  Secondary hypercoagulable state: On Xarelto  for atrial fibrillation  3.  High-risk medication monitoring: On dofetilide .  Karsyn Jamie need BMP and magnesium today  4.  Chronic diastolic heart failure: No obvious volume overload.  Plan per primary cardiology.  Case discussed with primary cardiology  Follow up with Dr. Lawana Pray in 6 months  Signed, Brandolyn Shortridge Cortland Ding, MD

## 2023-07-06 NOTE — Progress Notes (Signed)
  Electrophysiology Office Note:   Date:  07/07/2023  ID:  [name] Sanford, DOB [birth year], MRN [id number]  Primary Cardiologist: [name] Burger, MD Primary Heart Failure: None Electrophysiologist: [name] Braunschweig Cortland Ding, MD      History of Present Illness:   [name] Sanford is a 68 y.o. male with h/o COPD, hypertension, diabetes, tobacco abuse, diastolic heart failure, atrial fibrillation seen today for routine electrophysiology followup.   Recently seen by his primary cardiologist who noted him to be in atrial flutter.  He is on dofetilide .  Since last being seen in our clinic the patient reports doing overall well.  He was found to be in atrial flutter at his most recent clinic visit.  He does note some potential increased fatigue and shortness of breath.  Despite this, he is able to do all of his daily activities without any restriction.  He continues to be quite active.  he denies chest pain, palpitations, PND, orthopnea, nausea, vomiting, dizziness, syncope, edema, weight gain, or early satiety.   Review of systems complete and found to be negative unless listed in HPI.   EP Information / Studies Reviewed:    EKG is not ordered today. EKG from 06/25/23 reviewed which showed atrial fibrillation        Risk Assessment/Calculations:    CHA2DS2-VASc Score =     This indicates a  % annual risk of stroke. The patient's score is based upon:             Physical Exam:   VS:  BP (!) 110/52   Pulse 80   Ht 6' (1.829 m)   Wt 212 lb 9.6 oz (96.4 kg)   SpO2 93%   BMI 28.83 kg/m    Wt Readings from Last 3 Encounters:  07/07/23 212 lb 9.6 oz (96.4 kg)  06/25/23 205 lb 12.8 oz (93.4 kg)  06/14/22 215 lb 6.4 oz (97.7 kg)     GEN: Well nourished, well developed in no acute distress NECK: No JVD; No carotid bruits CARDIAC: Irregularly irregular rate and rhythm, no murmurs, rubs, gallops RESPIRATORY:  Clear to auscultation without rales, wheezing or rhonchi  ABDOMEN: Soft, non-tender,  non-distended EXTREMITIES:  No edema; No deformity   ASSESSMENT AND PLAN:    1.  Persistent atrial fibrillation/flutter: On dofetilide .  He is unfortunately back in atrial fibrillation.  He feels somewhat fatigued and short of breath.  He would prefer rhythm control.  At this point, he is not quite yet ready for ablation.  [name] Sanford plan for cardioversion.  2.  Secondary hypercoagulable state: On Xarelto  for atrial fibrillation  3.  High-risk medication monitoring: On dofetilide .  [name] Sanford need BMP and magnesium today  4.  Chronic diastolic heart failure: No obvious volume overload.  Plan per primary cardiology.  Case discussed with primary cardiology  Follow up with Dr. Lawana Pray in 6 months  Signed, [name] Shortridge Cortland Ding, MD

## 2023-07-07 ENCOUNTER — Ambulatory Visit: Attending: Cardiology | Admitting: Cardiology

## 2023-07-07 ENCOUNTER — Encounter: Payer: Self-pay | Admitting: Cardiology

## 2023-07-07 VITALS — BP 110/52 | HR 80 | Ht 72.0 in | Wt 212.6 lb

## 2023-07-07 DIAGNOSIS — Z79899 Other long term (current) drug therapy: Secondary | ICD-10-CM | POA: Diagnosis not present

## 2023-07-07 DIAGNOSIS — I5032 Chronic diastolic (congestive) heart failure: Secondary | ICD-10-CM | POA: Diagnosis not present

## 2023-07-07 DIAGNOSIS — D6869 Other thrombophilia: Secondary | ICD-10-CM | POA: Diagnosis not present

## 2023-07-07 DIAGNOSIS — Z01812 Encounter for preprocedural laboratory examination: Secondary | ICD-10-CM

## 2023-07-07 DIAGNOSIS — I4819 Other persistent atrial fibrillation: Secondary | ICD-10-CM

## 2023-07-07 NOTE — Patient Instructions (Addendum)
 Medication Instructions:  Your physician recommends that you continue on your current medications as directed. Please refer to the Current Medication list given to you today.  *If you need a refill on your cardiac medications before your next appointment, please call your pharmacy*  Lab Work: Pre procedure lab work today: BMET & CBC  If you have any lab test that is abnormal or we need to change your treatment, we will call you to review the results.  Testing/Procedures: Your physician has recommended that you have a Cardioversion (DCCV). Electrical Cardioversion uses a jolt of electricity to your heart either through paddles or wired patches attached to your chest. This is a controlled, usually prescheduled, procedure. Defibrillation is done under light anesthesia in the hospital, and you usually go home the day of the procedure. This is done to get your heart back into a normal rhythm. You are not awake for the procedure. Please see the instructions below located under "other instructions".    Follow-Up: At [facility], you and your health needs are our priority.  As part of our continuing mission to provide you with exceptional heart care, our providers are all part of one team.  This team includes your primary Cardiologist (physician) and Advanced Practice Providers or APPs (Physician Assistants and Nurse Practitioners) who all work together to provide you with the care you need, when you need it.  Your next appointment:   2 week(s) after your cardioversion on 07/15/23  Provider:   Pattricia Bores, NP Georgeana Kindler)     Your physician recommends that you schedule a follow-up appointment in: 6 months with Dr. Lawana Pray.   Thank you for choosing [facility]!!   Reece Cane, RN [phone]   Other Instructions      Dear [name] Sanford  You are scheduled for a Cardioversion on Tuesday, May 27 with Dr. Albert Huff.  Please arrive at the [facility] (Main Entrance A) at [facility]: [address] [place], [place] [address] at 9:00 AM (This time is 1 hour(s) before your procedure to ensure your preparation).   Free valet parking service is available. You will check in at ADMITTING.   *Please Note: You will receive a call the day before your procedure to confirm the appointment time. That time may have changed from the original time based on the schedule for that day.*    DIET:  Nothing to eat or drink after midnight except a sip of water with medications (see medication instructions below)  MEDICATION INSTRUCTIONS: !!IF ANY NEW MEDICATIONS ARE STARTED AFTER TODAY, PLEASE NOTIFY YOUR PROVIDER AS SOON AS POSSIBLE!!  FYI: Medications such as Semaglutide (Ozempic, [place]), Tirzepatide (Mounjaro, Zepbound), Dulaglutide (Trulicity), etc ("GLP1 agonists") AND Canagliflozin (Invokana), Dapagliflozin (Farxiga), Empagliflozin (Jardiance), Ertugliflozin (Steglatro), Bexagliflozin [employer]) or any combination with one of these drugs such as Invokamet (Canagliflozin/Metformin), Synjardy (Empagliflozin/Metformin), etc ("SGLT2 inhibitors") must be held around the time of a procedure. This is not a comprehensive list of all of these drugs. Please review all of your medications and talk to your provider if you take any one of these. If you are not sure, ask your provider.   Hold all morning medications the morning of the procedure -- ONLY TAKE YOUR TIKOSYN    Continue taking your anticoagulant (blood thinner): Rivaroxaban  (Xarelto ).  You will need to continue this after your procedure until you are told by your provider that it is safe to stop.    LABS: today, 5/19, in office  FYI:  For your  safety, and to allow us  to monitor your vital signs accurately during the surgery/procedure we request: If you have artificial nails, gel coating, SNS etc, please have those removed prior to your surgery/procedure. Not having the nail coverings /polish removed may result in cancellation  or delay of your surgery/procedure.  Your support person will be asked to wait in the waiting room during your procedure.  It is OK to have someone drop you off and come back when you are ready to be discharged.  You cannot drive after the procedure and will need someone to drive you home.  Bring your insurance cards.  *Special Note: Every effort is made to have your procedure done on time. Occasionally there are emergencies that occur at the hospital that may cause delays. Please be patient if a delay does occur.

## 2023-07-08 LAB — CBC
Hematocrit: 41 % (ref 37.5–51.0)
Hemoglobin: 13.2 g/dL (ref 13.0–17.7)
MCH: 29.7 pg (ref 26.6–33.0)
MCHC: 32.2 g/dL (ref 31.5–35.7)
MCV: 92 fL (ref 79–97)
Platelets: 155 10*3/uL (ref 150–450)
RBC: 4.45 x10E6/uL (ref 4.14–5.80)
RDW: 12.9 % (ref 11.6–15.4)
WBC: 5.3 10*3/uL (ref 3.4–10.8)

## 2023-07-08 LAB — BASIC METABOLIC PANEL WITH GFR
BUN/Creatinine Ratio: 17 (ref 10–24)
BUN: 17 mg/dL (ref 8–27)
CO2: 23 mmol/L (ref 20–29)
Calcium: 9.1 mg/dL (ref 8.6–10.2)
Chloride: 97 mmol/L (ref 96–106)
Creatinine, Ser: 1.02 mg/dL (ref 0.76–1.27)
Glucose: 132 mg/dL — ABNORMAL HIGH (ref 70–99)
Potassium: 4.6 mmol/L (ref 3.5–5.2)
Sodium: 136 mmol/L (ref 134–144)
eGFR: 82 mL/min/{1.73_m2} (ref >59)

## 2023-07-09 ENCOUNTER — Telehealth: Payer: Self-pay | Admitting: Cardiology

## 2023-07-09 NOTE — Telephone Encounter (Addendum)
 Patient aware no samples available. Advise to contact PCP, if they don't have it I'll send a message to Anticoagulant pool for samples per patient request as a last resort.

## 2023-07-09 NOTE — Telephone Encounter (Signed)
 Patient calling the office for samples of medication:   1.  What medication and dosage are you requesting samples for?XARELTO  20 MG TABS tablet   2.  Are you currently out of this medication? Yes  Patient is having a procedure on 07/15/23 and would like to make sure he does not skip a dosage before his upcoming procedure. Patient stated the pharmacy will not have the medication until tomorrow and would like to know if we can give him samples to last him. Please advise.

## 2023-07-11 NOTE — Progress Notes (Signed)
 Patient returned RN's call, and confirmed information previously stated in original pre-procedure note.

## 2023-07-11 NOTE — Progress Notes (Signed)
 Called patient with pre-procedure instructions for Tuesday Jul 15, 2023. Detailed voicemail left with instructions  Patient informed of:   Time to arrive for procedure. 0845 Remain NPO past midnight.  Must have a ride home and a responsible adult to remain with them for 24 hours post procedure.  Confirmed blood thinner. Xarelto  Confirmed no breaks in taking blood thinner for 3+ weeks prior to procedure.

## 2023-07-15 ENCOUNTER — Other Ambulatory Visit: Payer: Self-pay

## 2023-07-15 ENCOUNTER — Encounter (HOSPITAL_COMMUNITY): Payer: Self-pay | Admitting: Cardiology

## 2023-07-15 ENCOUNTER — Ambulatory Visit (HOSPITAL_COMMUNITY): Admitting: Anesthesiology

## 2023-07-15 ENCOUNTER — Ambulatory Visit (HOSPITAL_COMMUNITY)
Admission: RE | Admit: 2023-07-15 | Discharge: 2023-07-15 | Disposition: A | Discharge status: Home / Self Care | Attending: Cardiology | Admitting: Cardiology

## 2023-07-15 ENCOUNTER — Encounter (HOSPITAL_COMMUNITY)
Admission: RE | Disposition: A | Discharge status: Home / Self Care | Payer: Self-pay | Source: Home / Self Care | Attending: Cardiology

## 2023-07-15 DIAGNOSIS — I4891 Unspecified atrial fibrillation: Secondary | ICD-10-CM

## 2023-07-15 DIAGNOSIS — I5032 Chronic diastolic (congestive) heart failure: Secondary | ICD-10-CM | POA: Insufficient documentation

## 2023-07-15 DIAGNOSIS — I509 Heart failure, unspecified: Secondary | ICD-10-CM | POA: Diagnosis not present

## 2023-07-15 DIAGNOSIS — Z720 Tobacco use: Secondary | ICD-10-CM | POA: Insufficient documentation

## 2023-07-15 DIAGNOSIS — I4892 Unspecified atrial flutter: Secondary | ICD-10-CM | POA: Diagnosis not present

## 2023-07-15 DIAGNOSIS — I4819 Other persistent atrial fibrillation: Secondary | ICD-10-CM | POA: Insufficient documentation

## 2023-07-15 DIAGNOSIS — E119 Type 2 diabetes mellitus without complications: Secondary | ICD-10-CM | POA: Insufficient documentation

## 2023-07-15 DIAGNOSIS — F17210 Nicotine dependence, cigarettes, uncomplicated: Secondary | ICD-10-CM | POA: Diagnosis not present

## 2023-07-15 DIAGNOSIS — I110 Hypertensive heart disease with heart failure: Secondary | ICD-10-CM | POA: Diagnosis not present

## 2023-07-15 DIAGNOSIS — J449 Chronic obstructive pulmonary disease, unspecified: Secondary | ICD-10-CM

## 2023-07-15 DIAGNOSIS — D6869 Other thrombophilia: Secondary | ICD-10-CM | POA: Insufficient documentation

## 2023-07-15 DIAGNOSIS — I480 Paroxysmal atrial fibrillation: Secondary | ICD-10-CM

## 2023-07-15 DIAGNOSIS — Z79899 Other long term (current) drug therapy: Secondary | ICD-10-CM | POA: Insufficient documentation

## 2023-07-15 DIAGNOSIS — Z7901 Long term (current) use of anticoagulants: Secondary | ICD-10-CM | POA: Diagnosis not present

## 2023-07-15 HISTORY — PX: CARDIOVERSION: EP1203

## 2023-07-15 SURGERY — CARDIOVERSION (CATH LAB)
Anesthesia: General

## 2023-07-15 MED ORDER — SODIUM CHLORIDE 0.9 % IV SOLN: INTRAVENOUS | Status: DC

## 2023-07-15 MED ORDER — PROPOFOL 10 MG/ML IV BOLUS
INTRAVENOUS | Status: DC | PRN
  Administered 2023-07-15: 80 mg via INTRAVENOUS

## 2023-07-15 MED ORDER — LIDOCAINE 2% (20 MG/ML) 5 ML SYRINGE
INTRAMUSCULAR | Status: DC | PRN
  Administered 2023-07-15: 40 mg via INTRAVENOUS

## 2023-07-15 SURGICAL SUPPLY — 1 items: PAD DEFIB RADIO PHYSIO CONN (PAD) ×1 IMPLANT

## 2023-07-15 NOTE — Anesthesia Preprocedure Evaluation (Signed)
 Anesthesia Evaluation  Patient identified by MRN, date of birth, ID band Patient awake    Reviewed: Allergy & Precautions, H&P , NPO status , Patient's Chart, lab work & pertinent test results  Airway Mallampati: II  TM Distance: >3 FB Neck ROM: Full    Dental no notable dental hx.    Pulmonary COPD, Current Smoker and Patient abstained from smoking.   Pulmonary exam normal breath sounds clear to auscultation       Cardiovascular +CHF  Normal cardiovascular exam+ dysrhythmias Atrial Fibrillation  Rhythm:Regular Rate:Normal     Neuro/Psych   Anxiety Depression    negative neurological ROS  negative psych ROS   GI/Hepatic negative GI ROS, Neg liver ROS,,,  Endo/Other  negative endocrine ROS    Renal/GU negative Renal ROS  negative genitourinary   Musculoskeletal  (+) Arthritis ,    Abdominal  (+) + obese  Peds negative pediatric ROS (+)  Hematology negative hematology ROS (+)   Anesthesia Other Findings   Reproductive/Obstetrics negative OB ROS                             Anesthesia Physical Anesthesia Plan  ASA: 3  Anesthesia Plan: General   Post-op Pain Management: Minimal or no pain anticipated   Induction: Intravenous  PONV Risk Score and Plan: 1 and Propofol infusion and Treatment may vary due to age or medical condition  Airway Management Planned: Nasal Cannula and Mask  Additional Equipment:   Intra-op Plan:   Post-operative Plan:   Informed Consent: I have reviewed the patients History and Physical, chart, labs and discussed the procedure including the risks, benefits and alternatives for the proposed anesthesia with the patient or authorized representative who has indicated his/her understanding and acceptance.     Dental advisory given  Plan Discussed with: CRNA  Anesthesia Plan Comments:        Anesthesia Quick Evaluation

## 2023-07-15 NOTE — Anesthesia Postprocedure Evaluation (Signed)
 Anesthesia Post Note  Patient: [name] Sanford  Procedure(s) Performed: CARDIOVERSION     Patient location during evaluation: PACU Anesthesia Type: General Level of consciousness: awake and alert Pain management: pain level controlled Vital Signs Assessment: post-procedure vital signs reviewed and stable Respiratory status: spontaneous breathing, nonlabored ventilation and respiratory function stable Cardiovascular status: blood pressure returned to baseline and stable Postop Assessment: no apparent nausea or vomiting Anesthetic complications: no   No notable events documented.  Last Vitals:  Vitals:   07/15/23 0950 07/15/23 0955  BP: (!) 145/79 138/82  Pulse: 69 72  Resp: 17 15  Temp:  36.8 C  SpO2: 95% 94%    Last Pain:  Vitals:   07/15/23 0955  TempSrc: Temporal  PainSc: 0-No pain                 Earvin Goldberg

## 2023-07-15 NOTE — CV Procedure (Signed)
   DIRECT CURRENT CARDIOVERSION  NAME:  [name] Sanford    MRN: [id number] DOB:  1955/04/24    ADMIT DATE: 07/15/2023  Indication:  Symptomatic atrial fibrillation  Procedure Note:  The patient signed informed consent.  They have had had therapeutic anticoagulation with Xarelto  greater than 3 weeks.  Anesthesia was administered by Dr. Annabell Key.  Adequate airway was maintained throughout and vital followed per protocol.  They were cardioverted x 1 with 200J of biphasic synchronized energy.  They converted to NSR.  There were no apparent complications.  The patient had normal neuro status and respiratory status post procedure with vitals stable as recorded elsewhere.    Spouse (Ms. Spencer Dy) updated post-procedure.   Follow up: They will continue on current medical therapy and follow up with cardiology as scheduled.  Awilda Bogus, [facility]  A Division of [facility] [address], [place], [place] [address]  [place], [place] [address] 07/15/23 9:38 AM

## 2023-07-15 NOTE — Interval H&P Note (Signed)
 History and Physical Interval Note:  07/15/2023 9:10 AM  Wynetta Heckle  has presented today for surgery, with the diagnosis of afib.  The various methods of treatment have been discussed with the patient and family. After consideration of risks, benefits and other options for treatment, the patient has consented to  Procedure(s): CARDIOVERSION (N/A) as a surgical intervention.  The patient's history has been reviewed, patient examined, no change in status, stable for surgery.  I have reviewed the patient's chart and labs.  Questions were answered to the patient's satisfaction.    No missed doses of Xarelto .   Informed Consent   Shared Decision Making/Informed Consent The risks (stroke, cardiac arrhythmias rarely resulting in the need for a temporary or permanent pacemaker, skin irritation or burns and complications associated with conscious sedation including aspiration, arrhythmia, respiratory failure and death), benefits (restoration of normal sinus rhythm) and alternatives of a direct current cardioversion were explained in detail to Mr. Bracey and he agrees to proceed.       Awilda Bogus, [facility]  A Division of [facility] [address] [place], [place] [address]  [place], [place] [address] 9:10 AM 07/15/23

## 2023-07-15 NOTE — Transfer of Care (Signed)
 Immediate Anesthesia Transfer of Care Note  Patient: [name] Sanford  Procedure(s) Performed: CARDIOVERSION  Patient Location: Cath Lab  Anesthesia Type:General  Level of Consciousness: drowsy  Airway & Oxygen Therapy: Patient Spontanous Breathing and Patient connected to nasal cannula oxygen  Post-op Assessment: Report given to RN and Post -op Vital signs reviewed and stable  Post vital signs: Reviewed and stable  Last Vitals:  Vitals Value Taken Time  BP 169/97 07/15/23 0915  Temp    Pulse 65 07/15/23 0915  Resp 20 07/15/23 0915  SpO2 100 % 07/15/23 0915  Vitals shown include unfiled device data.  Last Pain:  Vitals:   07/15/23 0831  TempSrc:   PainSc: 0-No pain         Complications: No notable events documented.

## 2023-07-15 NOTE — Discharge Instructions (Signed)

## 2023-07-28 NOTE — Progress Notes (Signed)
 " Cardiology Office Note   Date:  07/29/2023  ID:  [name] Sanford, DOB 06/26/1955, MRN [id number] PCP: Gayl Males, MD  [facility] Providers Cardiologist:  Lamar Fitch, MD Electrophysiologist:  Soyla Gladis Norton, MD     History of Present Illness Sim Choquette is a 68 y.o. male with a past medical history of atrial fibrillation/flutter, HFpEF, aortoiliac atherosclerosis noted on CT imaging, COPD, history of recurrent UTIs, dyslipidemia, back pain with, tobacco abuse.  07/15/2023 DCCV 06/28/2022 echo EF 60 to 65%, no valvular abnormalities 05/09/2019 echo EF 55 to 60%, moderate LVH  Mr. Cybulski established with [facility] initially in 2018 for chest pain of uncertain origin that was concerning for angina, it appears a stress echo was arranged however records not available for review.  He was not evaluated by [facility] again until 2021 for atrial fibrillation after he had presented to the emergency department for worsening shortness of breath, he was found to be on heart failure exacerbation, also newly diagnosed A-fib and was started on Xarelto  and spontaneously converted overnight.  Most recently evaluated by Dr. Krasowski on 06/25/2023 for routine follow-up, was feeling well however EKG reveals he was in atrial flutter, was advised to follow-up with EP regarding cardioversion versus potential ablation.  He was evaluated by Dr. Norton on 07/07/2023, decision was made to proceed with a cardioversion and this was completed on 527 2024 x 1 at 200 J with conversion to sinus rhythm.  He presents today for follow-up of his atrial fibrillation, unfortunately he is in atrial fibrillation today, he was unaware, states he was relatively unaware of the last time as well.  He is bothered by fatigue however this appears to be a persistent issue for him.  He is tolerating his anticoagulant without any side effects.  We discussed alternative methods for managing his atrial fibrillation, he is considering an  ablation.He denies chest pain, palpitations, dyspnea, pnd, orthopnea, n, v, dizziness, syncope, edema, weight gain, or early satiety.     ROS: Review of Systems  Constitutional:  Positive for malaise/fatigue.  Musculoskeletal:  Positive for back pain.  All other systems reviewed and are negative.    Studies Reviewed EKG Interpretation Date/Time:  Tuesday July 29 2023 09:20:00 EDT Ventricular Rate:  58 PR Interval:    QRS Duration:  82 QT Interval:  462 QTC Calculation: 453 R Axis:   56  Text Interpretation: Atrial fibrillation with slow ventricular response Abnormal ECG When compared with ECG of 15-Jul-2023 09:36, Atrial fibrillation has replaced Sinus rhythm Confirmed by Carlin Nest ([id number]) on 07/29/2023 9:21:56 AM    Cardiac Studies & Procedures   ______________________________________________________________________________________________     ECHOCARDIOGRAM  ECHOCARDIOGRAM COMPLETE 06/28/2022  Narrative ECHOCARDIOGRAM REPORT    Patient Name:   [name] Sanford Date of Exam: 06/28/2022 Medical Rec #:  [id number]    Height:       72.0 in Accession #:    [id number]   Weight:       215.4 lb Date of Birth:  07-23-1955    BSA:          2.199 m Patient Age:    66 years     BP:           126/70 mmHg Patient Gender: M            HR:           56 bpm. Exam Location:  [place]  Procedure: 2D Echo, Cardiac Doppler, Color Doppler and Strain Analysis  Indications:  Dyspnea on exertion [R06.09 (ICD-10-CM)]  History:        Patient has prior history of Echocardiogram examinations, most recent 05/09/2019. CHF, COPD, Arrythmias:Atrial Fibrillation, Signs/Symptoms:Dyspnea; Risk Factors:Hypertension, Diabetes, Current Smoker and Dyslipidemia.  Sonographer:    Charlie Jointer RDCS Referring Phys: [id number] ROBERT J KRASOWSKI  IMPRESSIONS   1. Left ventricular ejection fraction, by estimation, is 60 to 65%. The left ventricle has normal function. The left ventricle has no regional  wall motion abnormalities. Left ventricular diastolic parameters were normal. The average left ventricular global longitudinal strain is -23.7 %. The global longitudinal strain is normal. 2. Right ventricular systolic function is normal. The right ventricular size is normal. There is normal pulmonary artery systolic pressure. 3. The mitral valve is normal in structure. No evidence of mitral valve regurgitation. No evidence of mitral stenosis. 4. The aortic valve is normal in structure. Aortic valve regurgitation is not visualized. No aortic stenosis is present. 5. The inferior vena cava is normal in size with greater than 50% respiratory variability, suggesting right atrial pressure of 3 mmHg.  FINDINGS Left Ventricle: Left ventricular ejection fraction, by estimation, is 60 to 65%. The left ventricle has normal function. The left ventricle has no regional wall motion abnormalities. The average left ventricular global longitudinal strain is -23.7 %. The global longitudinal strain is normal. The left ventricular internal cavity size was normal in size. There is no left ventricular hypertrophy. Left ventricular diastolic parameters were normal.  Right Ventricle: The right ventricular size is normal. No increase in right ventricular wall thickness. Right ventricular systolic function is normal. There is normal pulmonary artery systolic pressure. The tricuspid regurgitant velocity is 2.42 m/s, and with an assumed right atrial pressure of 3 mmHg, the estimated right ventricular systolic pressure is 26.4 mmHg.  Left Atrium: Left atrial size was normal in size.  Right Atrium: Right atrial size was normal in size.  Pericardium: There is no evidence of pericardial effusion.  Mitral Valve: The mitral valve is normal in structure. No evidence of mitral valve regurgitation. No evidence of mitral valve stenosis.  Tricuspid Valve: The tricuspid valve is normal in structure. Tricuspid valve regurgitation is  not demonstrated. No evidence of tricuspid stenosis.  Aortic Valve: The aortic valve is normal in structure. Aortic valve regurgitation is not visualized. No aortic stenosis is present.  Pulmonic Valve: The pulmonic valve was normal in structure. Pulmonic valve regurgitation is not visualized. No evidence of pulmonic stenosis.  Aorta: The aortic root is normal in size and structure.  Venous: The inferior vena cava is normal in size with greater than 50% respiratory variability, suggesting right atrial pressure of 3 mmHg.  IAS/Shunts: No atrial level shunt detected by color flow Doppler.   LEFT VENTRICLE PLAX 2D LVIDd:         5.60 cm   Diastology LVIDs:         3.80 cm   LV e' medial:    10.40 cm/s LV PW:         0.90 cm   LV E/e' medial:  10.3 LV IVS:        0.80 cm   LV e' lateral:   13.70 cm/s LVOT diam:     1.90 cm   LV E/e' lateral: 7.8 LV SV:         90 LV SV Index:   41        2D Longitudinal Strain LVOT Area:     2.84 cm  2D Strain GLS Avg:     -  23.7 %   RIGHT VENTRICLE             IVC RV Basal diam:  3.20 cm     IVC diam: 2.00 cm RV Mid diam:    2.50 cm RV S prime:     17.00 cm/s TAPSE (M-mode): 3.7 cm  LEFT ATRIUM             Index        RIGHT ATRIUM           Index LA diam:        3.80 cm 1.73 cm/m   RA Area:     20.40 cm LA Vol (A2C):   71.8 ml 32.65 ml/m  RA Volume:   57.00 ml  25.92 ml/m LA Vol (A4C):   74.5 ml 33.88 ml/m LA Biplane Vol: 74.9 ml 34.06 ml/m AORTIC VALVE LVOT Vmax:   128.67 cm/s LVOT Vmean:  82.967 cm/s LVOT VTI:    0.318 m  AORTA Ao Root diam: 3.20 cm Ao Desc diam: 2.30 cm  MITRAL VALVE                TRICUSPID VALVE MV Area (PHT): 3.73 cm     TR Peak grad:   23.4 mmHg MV Decel Time: 204 msec     TR Vmax:        242.00 cm/s MV E velocity: 107.00 cm/s MV A velocity: 83.95 cm/s   SHUNTS MV E/A ratio:  1.27         Systemic VTI:  0.32 m Systemic Diam: 1.90 cm  Lamar Fitch MD Electronically signed by Lamar Fitch  MD Signature Date/Time: 06/28/2022/12:17:10 PM    Final          ______________________________________________________________________________________________      Risk Assessment/Calculations  CHA2DS2-VASc Score = 3   This indicates a 3.2% annual risk of stroke. The patient's score is based upon: CHF History: 1 HTN History: 0 Diabetes History: 0 Stroke History: 0 Vascular Disease History: 1 Age Score: 1 Gender Score: 0            Physical Exam VS:  BP 100/80   Pulse (!) 58   Ht 6' (1.829 m)   Wt 211 lb (95.7 kg)   SpO2 97%   BMI 28.62 kg/m    Wt Readings from Last 3 Encounters:  07/29/23 211 lb (95.7 kg)  07/07/23 212 lb 9.6 oz (96.4 kg)  06/25/23 205 lb 12.8 oz (93.4 kg)    GEN: Well nourished, well developed in no acute distress NECK: No JVD; No carotid bruits CARDIAC: Irregularly irregular, no murmurs, rubs, gallops RESPIRATORY: Slight expiratory wheezing, otherwise clear ABDOMEN: Soft, non-tender, non-distended EXTREMITIES:  No edema; No deformity   ASSESSMENT AND PLAN Persistent atrial flutter/fibrillation/hypercoagulable state/high risk medication use- s/p DCCV 07/15/2023 initially successful unfortunately, he is back in atrial fibrillation today, he was unaware of this, and is overall asymptomatic.  Continue Xarelto  20 mg daily--no indication for dose reduction CrCl 98 and he is tolerating without hematochezia, hematuria, hemoptysis.  Currently on Tikosyn  500 mg twice daily, will repeat magnesium level today.  He is not sure if he would like to try different medication for his atrial fibrillation versus an ablation, versus rate control strategies.  He is amenable to meet with Dr. Inocencio again to discuss atrial fibrillation ablation in further detail.  Will repeat echocardiogram.  Aortic atherosclerosis-noted on CT imaging, Stable with no anginal symptoms. No indication for ischemic evaluation.    HFpEF-NYHA class  I-II, possibly for fatigue however this  is a pretty vague description for him it sounds to be a persistent issue.  He is euvolemic.  Tobacco abuse-currently smoking 1 pack/day which is a decrease from in the past however he is simply not ready for cessation at this time but he is well aware of the adverse side effects of tobacco abuse.         Dispo: Will check magnesium level today, repeat echocardiogram, follow-up with Dr. Inocencio in [place] to discuss atrial fibrillation ablation, keep follow-up already arranged with Dr. Krasowski.  Signed, Delon JAYSON Hoover, NP  "

## 2023-07-29 ENCOUNTER — Encounter: Payer: Self-pay | Admitting: Cardiology

## 2023-07-29 ENCOUNTER — Ambulatory Visit: Attending: Cardiology | Admitting: Cardiology

## 2023-07-29 VITALS — BP 100/80 | HR 58 | Ht 72.0 in | Wt 211.0 lb

## 2023-07-29 DIAGNOSIS — I700 Atherosclerosis of aorta: Secondary | ICD-10-CM

## 2023-07-29 DIAGNOSIS — D6859 Other primary thrombophilia: Secondary | ICD-10-CM | POA: Diagnosis not present

## 2023-07-29 DIAGNOSIS — Z79899 Other long term (current) drug therapy: Secondary | ICD-10-CM | POA: Diagnosis not present

## 2023-07-29 DIAGNOSIS — I4819 Other persistent atrial fibrillation: Secondary | ICD-10-CM

## 2023-07-29 DIAGNOSIS — I5032 Chronic diastolic (congestive) heart failure: Secondary | ICD-10-CM

## 2023-07-29 DIAGNOSIS — Z720 Tobacco use: Secondary | ICD-10-CM

## 2023-07-29 NOTE — Patient Instructions (Signed)
 Medication Instructions:  Your physician recommends that you continue on your current medications as directed. Please refer to the Current Medication list given to you today.  *If you need a refill on your cardiac medications before your next appointment, please call your pharmacy*  Lab Work: Your physician recommends that you return for lab work in:   Labs today: Magnesium  If you have labs (blood work) drawn today and your tests are completely normal, you will receive your results only by: MyChart Message (if you have MyChart) OR A paper copy in the mail If you have any lab test that is abnormal or we need to change your treatment, we will call you to review the results.  Testing/Procedures: Your physician has requested that you have an echocardiogram. Echocardiography is a painless test that uses sound waves to create images of your heart. It provides your doctor with information about the size and shape of your heart and how well your heart's chambers and valves are working. This procedure takes approximately one hour. There are no restrictions for this procedure. Please do NOT wear cologne, perfume, aftershave, or lotions (deodorant is allowed). Please arrive 15 minutes prior to your appointment time.  Please note: We ask at that you not bring children with you during ultrasound (echo/ vascular) testing. Due to room size and safety concerns, children are not allowed in the ultrasound rooms during exams. Our front office staff cannot provide observation of children in our lobby area while testing is being conducted. An adult accompanying a patient to their appointment will only be allowed in the ultrasound room at the discretion of the ultrasound technician under special circumstances. We apologize for any inconvenience.   Follow-Up: At [facility], you and your health needs are our priority.  As part of our continuing mission to provide you with exceptional heart care, our  providers are all part of one team.  This team includes your primary Cardiologist (physician) and Advanced Practice Providers or APPs (Physician Assistants and Nurse Practitioners) who all work together to provide you with the care you need, when you need it.  Your next appointment:   Follow up with Dr. Lawana Pray regarding ablation  Provider:   Agatha Horsfall, MD   We recommend signing up for the patient portal called "MyChart".  Sign up information is provided on this After Visit Summary.  MyChart is used to connect with patients for Virtual Visits (Telemedicine).  Patients are able to view lab/test results, encounter notes, upcoming appointments, etc.  Non-urgent messages can be sent to your provider as well.   To learn more about what you can do with MyChart, go to [email or web address].   Other Instructions None

## 2023-07-30 ENCOUNTER — Ambulatory Visit: Payer: Self-pay | Admitting: Cardiology

## 2023-07-30 DIAGNOSIS — I5032 Chronic diastolic (congestive) heart failure: Secondary | ICD-10-CM

## 2023-07-30 LAB — MAGNESIUM: Magnesium: 2.3 mg/dL (ref 1.6–2.3)

## 2023-07-30 NOTE — Telephone Encounter (Signed)
 Patient returned RN's call.

## 2023-09-02 ENCOUNTER — Ambulatory Visit: Attending: Cardiology

## 2023-09-02 DIAGNOSIS — I4819 Other persistent atrial fibrillation: Secondary | ICD-10-CM

## 2023-09-02 DIAGNOSIS — D6859 Other primary thrombophilia: Secondary | ICD-10-CM | POA: Diagnosis not present

## 2023-09-02 DIAGNOSIS — Z79899 Other long term (current) drug therapy: Secondary | ICD-10-CM

## 2023-09-03 LAB — ECHOCARDIOGRAM COMPLETE
AR max vel: 2.49 cm2
AV Area VTI: 2.51 cm2
AV Area mean vel: 2.36 cm2
AV Mean grad: 5.7 mmHg
AV Peak grad: 10.4 mmHg
Ao pk vel: 1.61 m/s
Area-P 1/2: 4.77 cm2
S' Lateral: 2.8 cm

## 2023-09-09 NOTE — Telephone Encounter (Signed)
-----   Message from Delon JAYSON Hoover sent at 09/09/2023  8:13 AM EDT ----- His echocardiogram shows that his heart is squeezing normally.  Valves look okay.  He has mildly elevated pressure in 1 portion of his heart, this could be causing some shortness of breath/fatigue.  Have him come in for a BMET and a proBNP, we may start him on a low-dose of the fluid pill to see if this helps with his symptoms. ----- Message ----- From: Interface, [employer] Sent: 09/03/2023   5:11 PM EDT To: Delon JAYSON Hoover, NP

## 2023-09-09 NOTE — Telephone Encounter (Signed)
 MyChart message

## 2023-09-09 NOTE — Progress Notes (Signed)
 Patient informed of the results of his test. Labs ordered via Epic.

## 2023-09-11 LAB — BASIC METABOLIC PANEL WITH GFR
BUN/Creatinine Ratio: 17 (ref 10–24)
BUN: 17 mg/dL (ref 8–27)
CO2: 25 mmol/L (ref 20–29)
Calcium: 9 mg/dL (ref 8.6–10.2)
Chloride: 101 mmol/L (ref 96–106)
Creatinine, Ser: 1.01 mg/dL (ref 0.76–1.27)
Glucose: 82 mg/dL (ref 70–99)
Potassium: 4.3 mmol/L (ref 3.5–5.2)
Sodium: 141 mmol/L (ref 134–144)
eGFR: 82 mL/min/1.73

## 2023-09-11 LAB — PRO B NATRIURETIC PEPTIDE: NT-Pro BNP: 205 pg/mL (ref 0–376)

## 2023-09-14 NOTE — Progress Notes (Unsigned)
  Electrophysiology Office Note:   Date:  09/14/2023  ID:  [name] Sanford, DOB [birth year], MRN [id number]  Primary Cardiologist: Lamar Fitch, MD Primary Heart Failure: None Electrophysiologist: Halen Antenucci Gladis Norton, MD  {Click to update primary MD,subspecialty MD or APP then REFRESH:1}    History of Present Illness:   [name] Sanford is a 68 y.o. male with h/o COPD, hypertension, diabetes, tobacco abuse, diastolic heart failure, atrial fibrillation seen today for routine electrophysiology followup.   Since last being seen in our clinic the patient reports doing ***.  he denies chest pain, palpitations, dyspnea, PND, orthopnea, nausea, vomiting, dizziness, syncope, edema, weight gain, or early satiety.   Review of systems complete and found to be negative unless listed in HPI.   EP Information / Studies Reviewed:    {EKGtoday:28818}       Risk Assessment/Calculations:    CHA2DS2-VASc Score = 3  {Confirm score is correct.  If not, click here to update score.  REFRESH note.  :1} This indicates a 3.2% annual risk of stroke. The patient's score is based upon: CHF History: 1 HTN History: 0 Diabetes History: 0 Stroke History: 0 Vascular Disease History: 1 Age Score: 1 Gender Score: 0   {This patient has a significant risk of stroke if diagnosed with atrial fibrillation.  Please consider VKA or DOAC agent for anticoagulation if the bleeding risk is acceptable.   You can also use the SmartPhrase .[email or web address] for documentation.   :[id number]}         Physical Exam:   VS:  There were no vitals taken for this visit.   Wt Readings from Last 3 Encounters:  07/29/23 211 lb (95.7 kg)  07/07/23 212 lb 9.6 oz (96.4 kg)  06/25/23 205 lb 12.8 oz (93.4 kg)     GEN: Well nourished, well developed in no acute distress NECK: No JVD; No carotid bruits CARDIAC: {EPRHYTHM:28826}, no murmurs, rubs, gallops RESPIRATORY:  Clear to auscultation without rales, wheezing or rhonchi  ABDOMEN: Soft,  non-tender, non-distended EXTREMITIES:  No edema; No deformity   ASSESSMENT AND PLAN:    1.  Persistent atrial fibrillation/flutter: On dofetilide .  Post cardioversion 07/15/2023.***  2.  Secondary hypercoagulable state: On Xarelto   3.  High-risk medication monitoring: On dofetilide .  Recent labs within normal limits  4.  Chronic diastolic heart failure: No obvious volume overload.  Plan per primary cardiology.  Follow up with {EPMDS:28135::EP Team} {EPFOLLOW LE:71826}  Signed, Rehana Uncapher Gladis Norton, MD

## 2023-09-15 ENCOUNTER — Ambulatory Visit: Attending: Cardiology | Admitting: Cardiology

## 2023-09-15 ENCOUNTER — Encounter: Payer: Self-pay | Admitting: Cardiology

## 2023-09-15 VITALS — BP 120/82 | HR 60 | Ht 72.0 in | Wt 206.4 lb

## 2023-09-15 DIAGNOSIS — I483 Typical atrial flutter: Secondary | ICD-10-CM | POA: Diagnosis not present

## 2023-09-15 DIAGNOSIS — I4819 Other persistent atrial fibrillation: Secondary | ICD-10-CM | POA: Diagnosis not present

## 2023-09-15 DIAGNOSIS — D6869 Other thrombophilia: Secondary | ICD-10-CM

## 2023-09-15 DIAGNOSIS — Z79899 Other long term (current) drug therapy: Secondary | ICD-10-CM | POA: Diagnosis not present

## 2023-09-15 NOTE — Patient Instructions (Addendum)
 Medication Instructions:  Your physician recommends that you continue on your current medications as directed. Please refer to the Current Medication list given to you today.  *If you need a refill on your cardiac medications before your next appointment, please call your pharmacy*   Lab Work: Pre procedure labs -- we will call you to schedule:  BMP & CBC  If you have a lab test that is abnormal and we need to change your treatment, we will call you to review the results -- otherwise no news is good news.    Testing/Procedures: Your physician has recommended that you have an ablation. Catheter ablation is a medical procedure used to treat some cardiac arrhythmias (irregular heartbeats). During catheter ablation, a long, thin, flexible tube is put into a blood vessel in your groin (upper thigh), or neck. This tube is called an ablation catheter. It is then guided to your heart through the blood vessel. Radio frequency waves destroy small areas of heart tissue where abnormal heartbeats may cause an arrhythmia to start.   We will call you to scheduled this for sometime in October/November, once that schedule opens.   Follow-Up: At [facility], you and your health needs are our priority.  As part of our continuing mission to provide you with exceptional heart care, we have created designated Provider Care Teams.  These Care Teams include your primary Cardiologist (physician) and Advanced Practice Providers (APPs -  Physician Assistants and Nurse Practitioners) who all work together to provide you with the care you need, when you need it.  Your next appointment:   1 month(s) after your ablation  The format for your next appointment:   In Person  Provider:   AFib clinic   Thank you for choosing [facility]!!   Maeola Domino, RN [phone]    Other Instructions   Cardiac Ablation Cardiac ablation is a procedure to destroy (ablate) some heart tissue that is sending bad  signals. These bad signals cause problems in heart rhythm. The heart has many areas that make these signals. If there are problems in these areas, they can make the heart beat in a way that is not normal. Destroying some tissues can help make the heart rhythm normal. Tell your doctor about: Any allergies you have. All medicines you are taking. These include vitamins, herbs, eye drops, creams, and over-the-counter medicines. Any problems you or family members have had with medicines that make you fall asleep (anesthetics). Any blood disorders you have. Any surgeries you have had. Any medical conditions you have, such as kidney failure. Whether you are pregnant or may be pregnant. What are the risks? This is a safe procedure. But problems may occur, including: Infection. Bruising and bleeding. Bleeding into the chest. Stroke or blood clots. Damage to nearby areas of your body. Allergies to medicines or dyes. The need for a pacemaker if the normal system is damaged. Failure of the procedure to treat the problem. What happens before the procedure? Medicines Ask your doctor about: Changing or stopping your normal medicines. This is important. Taking aspirin and ibuprofen. Do not take these medicines unless your doctor tells you to take them. Taking other medicines, vitamins, herbs, and supplements. General instructions Follow instructions from your doctor about what you cannot eat or drink. Plan to have someone take you home from the hospital or clinic. If you will be going home right after the procedure, plan to have someone with you for 24 hours. Ask your doctor what steps will  be taken to prevent infection. What happens during the procedure?  An IV tube will be put into one of your veins. You will be given a medicine to help you relax. The skin on your neck or groin will be numbed. A cut (incision) will be made in your neck or groin. A needle will be put through your cut and into a  large vein. A tube (catheter) will be put into the needle. The tube will be moved to your heart. Dye may be put through the tube. This helps your doctor see your heart. Small devices (electrodes) on the tube will send out signals. A type of energy will be used to destroy some heart tissue. The tube will be taken out. Pressure will be held on your cut. This helps stop bleeding. A bandage will be put over your cut. The exact procedure may vary among doctors and hospitals. What happens after the procedure? You will be watched until you leave the hospital or clinic. This includes checking your heart rate, breathing rate, oxygen, and blood pressure. Your cut will be watched for bleeding. You will need to lie still for a few hours. Do not drive for 24 hours or as long as your doctor tells you. Summary Cardiac ablation is a procedure to destroy some heart tissue. This is done to treat heart rhythm problems. Tell your doctor about any medical conditions you may have. Tell him or her about all medicines you are taking to treat them. This is a safe procedure. But problems may occur. These include infection, bruising, bleeding, and damage to nearby areas of your body. Follow what your doctor tells you about food and drink. You may also be told to change or stop some of your medicines. After the procedure, do not drive for 24 hours or as long as your doctor tells you. This information is not intended to replace advice given to you by your health care provider. Make sure you discuss any questions you have with your health care provider. Document Revised: 04/27/2021 Document Reviewed: 01/07/2019 Elsevier Patient Education  2023 [employer] Inc[employer]   Cardiac Ablation, Care After  This sheet gives you information about how to care for yourself after your procedure. Your health care provider may also give you more specific instructions. If you have problems or questions, contact your health care provider. What  can I expect after the procedure? After the procedure, it is common to have: Bruising around your puncture site. Tenderness around your puncture site. Skipped heartbeats. If you had an atrial fibrillation ablation, you may have atrial fibrillation during the first several months after your procedure.  Tiredness (fatigue).  Follow these instructions at home: Puncture site care  Follow instructions from your health care provider about how to take care of your puncture site. Make sure you: If present, leave stitches (sutures), skin glue, or adhesive strips in place. These skin closures may need to stay in place for up to 2 weeks. If adhesive strip edges start to loosen and curl up, you may trim the loose edges. Do not remove adhesive strips completely unless your health care provider tells you to do that. If a large square bandage is present, this may be removed 24 hours after surgery.  Check your puncture site every day for signs of infection. Check for: Redness, swelling, or pain. Fluid or blood. If your puncture site starts to bleed, lie down on your back, apply firm pressure to the area, and contact your health care provider. Warmth.  Pus or a bad smell. A pea or small marble sized lump at the site is normal and can take up to three months to resolve.  Driving Do not drive for at least 4 days after your procedure or however long your health care provider recommends. (Do not resume driving if you have previously been instructed not to drive for other health reasons.) Do not drive or use heavy machinery while taking prescription pain medicine. Activity Avoid activities that take a lot of effort for at least 7 days after your procedure. Do not lift anything that is heavier than 5 lb (4.5 kg) for one week.  No sexual activity for 1 week.  Return to your normal activities as told by your health care provider. Ask your health care provider what activities are safe for you. General  instructions Take over-the-counter and prescription medicines only as told by your health care provider. Do not use any products that contain nicotine  or tobacco, such as cigarettes and e-cigarettes. If you need help quitting, ask your health care provider. You may shower after 24 hours, but Do not take baths, swim, or use a hot tub for 1 week.  Do not drink alcohol  for 24 hours after your procedure. Keep all follow-up visits as told by your health care provider. This is important. Contact a health care provider if: You have redness, mild swelling, or pain around your puncture site. You have fluid or blood coming from your puncture site that stops after applying firm pressure to the area. Your puncture site feels warm to the touch. You have pus or a bad smell coming from your puncture site. You have a fever. You have chest pain or discomfort that spreads to your neck, jaw, or arm. You have chest pain that is worse with lying on your back or taking a deep breath. You are sweating a lot. You feel nauseous. You have a fast or irregular heartbeat. You have shortness of breath. You are dizzy or light-headed and feel the need to lie down. You have pain or numbness in the arm or leg closest to your puncture site. Get help right away if: Your puncture site suddenly swells. Your puncture site is bleeding and the bleeding does not stop after applying firm pressure to the area. These symptoms may represent a serious problem that is an emergency. Do not wait to see if the symptoms will go away. Get medical help right away. Call your local emergency services (911 in the [place] Do not drive yourself to the hospital. Summary After the procedure, it is normal to have bruising and tenderness at the puncture site in your groin, neck, or forearm. Check your puncture site every day for signs of infection. Get help right away if your puncture site is bleeding and the bleeding does not stop after applying firm  pressure to the area. This is a medical emergency. This information is not intended to replace advice given to you by your health care provider. Make sure you discuss any questions you have with your health care provider.

## 2023-09-25 ENCOUNTER — Telehealth: Payer: Self-pay | Admitting: Cardiology

## 2023-09-25 MED ORDER — DOFETILIDE 500 MCG PO CAPS
500.0000 ug | ORAL_CAPSULE | Freq: Two times a day (BID) | ORAL | 2 refills | Status: AC
Start: 2023-09-25 — End: ?

## 2023-09-25 NOTE — Telephone Encounter (Signed)
*  STAT* If patient is at the pharmacy, call can be transferred to refill team.   1. Which medications need to be refilled? (please list name of each medication and dose if known)  dofetilide  (TIKOSYN ) 500 MCG capsule  2. Which pharmacy/location (including street and city if local pharmacy) is medication to be sent to? [facility] [phone] - [place], [place] - [address]  3. Do they need a 30 day or 90 day supply?  90 day supply

## 2023-09-25 NOTE — Telephone Encounter (Signed)
 Chart reviewed and rx sent to the pharmacy.   Contacted the patient to inform him that rx has been sent to the pharmacy. No answer left message informing that med has been sent over and to contact the office with any questions or concerns.

## 2023-09-26 ENCOUNTER — Ambulatory Visit: Admitting: Cardiology

## 2023-09-29 ENCOUNTER — Ambulatory Visit: Admitting: Cardiology

## 2023-10-01 ENCOUNTER — Telehealth: Payer: Self-pay

## 2023-10-01 DIAGNOSIS — I4819 Other persistent atrial fibrillation: Secondary | ICD-10-CM

## 2023-10-01 NOTE — Telephone Encounter (Signed)
 Spoke with the patient and scheduled him for an ablation with Dr. Inocencio on 10/22. No CT needed per Dr. Inocencio.

## 2023-10-01 NOTE — Telephone Encounter (Signed)
 Left message for patient to call back to schedule an ablation with Dr. Inocencio.

## 2023-10-01 NOTE — Telephone Encounter (Signed)
 Patient was returning call. Please advise ?

## 2023-11-05 ENCOUNTER — Telehealth: Payer: Self-pay

## 2023-11-05 NOTE — Telephone Encounter (Signed)
   Pre-operative Risk Assessment    Patient Name: Banyan Goodchild  DOB: 21-Dec-1955 MRN: [id number]   Date of last office visit: 09/15/23 Date of next office visit: N/A   Request for Surgical Clearance    Procedure:  L4- L5 ES1  Date of Surgery:  Clearance TBD                                Surgeon:  Dr. Deatrice Manus Surgeon's Group or Practice Name:  [facility]  Phone number:  [phone] Fax number:  [phone]   Type of Clearance Requested:   - Pharmacy:  Hold Rivaroxaban  (Xarelto ) 3 day prior and resume the day after   Type of Anesthesia:  Not Indicated   Additional requests/questions:  Stat clearance request   Bonney Calvert Pouch   11/05/2023, 3:58 PM

## 2023-11-06 ENCOUNTER — Other Ambulatory Visit: Payer: Self-pay | Admitting: Cardiology

## 2023-11-06 DIAGNOSIS — I480 Paroxysmal atrial fibrillation: Secondary | ICD-10-CM

## 2023-11-10 NOTE — Telephone Encounter (Signed)
   Patient Name: [name] Sanford  DOB: [birth year] MRN: [id number]  Primary Cardiologist: Lamar Fitch, MD  Clinical pharmacists have reviewed the patient's past medical history, labs, and current medications as part of preoperative protocol coverage. The following recommendations have been made:  Patient with diagnosis of atrial fibrillation on rivaroxaban  for anticoagulation.     Procedure: L4-L5 ES1 Date of procedure: TBD     CHA2DS2-VASc Score = 4  This indicates a 4.8% annual risk of stroke. The patient's score is based upon: CHF History: 1 HTN History: 1 Diabetes History: 1 Stroke History: 0 Vascular Disease History: 0 Age Score: 1 Gender Score: 0     CrCl 95 mL/min (Scr 1.01, weight 93.6 kg) Platelet count 155K (07/07/23)   Patient has not had an Afib/aflutter ablation or Watchman within the last 3 months or DCCV within the last 30 days (DCCV in May 2025).    Of note, spinal procedure is not scheduled yet, but patient will need to be on uninterrupted anticoagulation for 3 weeks prior to and 90 days after Afib ablation scheduled for 12/10/23 with Dr. Inocencio.   Per office protocol, patient can hold rivaroxaban  for 3 days prior to procedure. Please resume Xarelto  as soon as possible postprocedure, at the discretion of the surgeon.    I will route this recommendation to the requesting party via Epic fax function and remove from pre-op pool.  Please call with questions.  Damien JAYSON Braver, NP 11/10/2023, 4:05 PM

## 2023-11-10 NOTE — Telephone Encounter (Signed)
 Patient with diagnosis of atrial fibrillation on rivaroxaban  for anticoagulation.    Procedure: L4-L5 ES1 Date of procedure: TBD   CHA2DS2-VASc Score = 4   This indicates a 4.8% annual risk of stroke. The patient's score is based upon: CHF History: 1 HTN History: 1 Diabetes History: 1 Stroke History: 0 Vascular Disease History: 0 Age Score: 1 Gender Score: 0      CrCl 95 mL/min (Scr 1.01, weight 93.6 kg) Platelet count 155K (07/07/23)  Patient has not had an Afib/aflutter ablation or Watchman within the last 3 months or DCCV within the last 30 days (DCCV in May 2025).   Of note, spinal procedure is not scheduled yet, but patient will need to be on uninterrupted anticoagulation for 3 weeks prior to and 90 days after Afib ablation scheduled for 12/10/23 with Dr. Inocencio.  Per office protocol, patient can hold rivaroxaban  for 3 days prior to procedure.   **This guidance is not considered finalized until pre-operative APP has relayed final recommendations.**

## 2023-11-18 ENCOUNTER — Encounter (HOSPITAL_COMMUNITY): Payer: Self-pay

## 2023-11-18 ENCOUNTER — Telehealth (HOSPITAL_COMMUNITY): Payer: Self-pay

## 2023-11-18 NOTE — Telephone Encounter (Signed)
 Attempted to reach patient to discuss upcoming procedure, no answer. Left VM for patient to return call.

## 2023-11-20 LAB — BASIC METABOLIC PANEL WITH GFR
BUN/Creatinine Ratio: 14 (ref 10–24)
BUN: 15 mg/dL (ref 8–27)
CO2: 25 mmol/L (ref 20–29)
Calcium: 9.4 mg/dL (ref 8.6–10.2)
Chloride: 99 mmol/L (ref 96–106)
Creatinine, Ser: 1.1 mg/dL (ref 0.76–1.27)
Glucose: 93 mg/dL (ref 70–99)
Potassium: 5.1 mmol/L (ref 3.5–5.2)
Sodium: 138 mmol/L (ref 134–144)
eGFR: 74 mL/min/1.73 (ref >59)

## 2023-11-20 LAB — CBC
Hematocrit: 47 % (ref 37.5–51.0)
Hemoglobin: 15 g/dL (ref 13.0–17.7)
MCH: 29.2 pg (ref 26.6–33.0)
MCHC: 31.9 g/dL (ref 31.5–35.7)
MCV: 92 fL (ref 79–97)
Platelets: 251 x10E3/uL (ref 150–450)
RBC: 5.13 x10E6/uL (ref 4.14–5.80)
RDW: 12.8 % (ref 11.6–15.4)
WBC: 6.4 x10E3/uL (ref 3.4–10.8)

## 2023-11-20 NOTE — Telephone Encounter (Signed)
 Patient is returning call.

## 2023-11-21 NOTE — Telephone Encounter (Signed)
 Spoke with patient to complete pre-procedure call.     Health status review:  Any new medical conditions, recent signs of acute illness or been started on antibiotics? No Any recent hospitalizations or surgeries? No Any new medications started since pre-op visit? No  Follow all medication instructions prior to procedure or the procedure may be rescheduled:    Continue taking Xarelto  (Rivaroxaban ) once daily without missing any doses before procedure. Essential chronic medications:  No medication should be continued, unless told otherwise. On the morning of your procedure DO NOT take any medication., including Xarelto  (Rivaroxaban ).  Nothing to eat or drink after midnight prior to your procedure.  Pre-procedure testing scheduled: lab work complete.  Confirmed patient is scheduled for Atrial Fibrillation Ablation on Wednesday, October 22 with Dr. Soyla Norton. Instructed patient to arrive at the Main Entrance A at [facility]: [address] [place], [place] [address] and check in at Admitting at 8:00 AM.  Advised of plan to go home the same day and will only stay overnight if medically necessary. You MUST have a responsible adult to drive you home and MUST be with you the first 24 hours after you arrive home or your procedure could be cancelled.  Informed patient a nurse will call a day before the procedure to confirm arrival time and ensure instructions are followed.  Patient verbalized understanding to information provided and is agreeable to proceed with procedure.   Advised patient to contact RN Navigator at [phone], to inform of any new medications started after call or concerns prior to procedure.

## 2023-12-09 NOTE — Pre-Procedure Instructions (Signed)
 Instructed patient on the following items: Arrival time 0730, new arrival time  Nothing to eat or drink after midnight No meds AM of procedure Responsible person to drive you home and stay with you for 24 hrs  Have you missed any doses of anti-coagulant Xarleto- takes once a day, hasn't missed any doses.

## 2023-12-10 ENCOUNTER — Ambulatory Visit (HOSPITAL_COMMUNITY)
Admission: RE | Admit: 2023-12-10 | Discharge: 2023-12-10 | Disposition: A | Discharge status: Home / Self Care | Attending: Cardiology | Admitting: Cardiology

## 2023-12-10 ENCOUNTER — Other Ambulatory Visit: Payer: Self-pay

## 2023-12-10 ENCOUNTER — Encounter (HOSPITAL_COMMUNITY)
Admission: RE | Disposition: A | Discharge status: Home / Self Care | Payer: Self-pay | Source: Home / Self Care | Attending: Cardiology

## 2023-12-10 ENCOUNTER — Encounter (HOSPITAL_COMMUNITY): Payer: Self-pay | Admitting: Cardiology

## 2023-12-10 ENCOUNTER — Ambulatory Visit (HOSPITAL_COMMUNITY): Admitting: Certified Registered Nurse Anesthetist

## 2023-12-10 DIAGNOSIS — I4819 Other persistent atrial fibrillation: Secondary | ICD-10-CM | POA: Diagnosis present

## 2023-12-10 DIAGNOSIS — I5032 Chronic diastolic (congestive) heart failure: Secondary | ICD-10-CM | POA: Diagnosis not present

## 2023-12-10 DIAGNOSIS — F17210 Nicotine dependence, cigarettes, uncomplicated: Secondary | ICD-10-CM | POA: Diagnosis not present

## 2023-12-10 DIAGNOSIS — J449 Chronic obstructive pulmonary disease, unspecified: Secondary | ICD-10-CM

## 2023-12-10 DIAGNOSIS — E119 Type 2 diabetes mellitus without complications: Secondary | ICD-10-CM | POA: Insufficient documentation

## 2023-12-10 DIAGNOSIS — I509 Heart failure, unspecified: Secondary | ICD-10-CM

## 2023-12-10 DIAGNOSIS — Z79899 Other long term (current) drug therapy: Secondary | ICD-10-CM | POA: Diagnosis not present

## 2023-12-10 DIAGNOSIS — I483 Typical atrial flutter: Secondary | ICD-10-CM | POA: Diagnosis not present

## 2023-12-10 DIAGNOSIS — I480 Paroxysmal atrial fibrillation: Secondary | ICD-10-CM | POA: Diagnosis not present

## 2023-12-10 DIAGNOSIS — Z720 Tobacco use: Secondary | ICD-10-CM | POA: Diagnosis not present

## 2023-12-10 DIAGNOSIS — I110 Hypertensive heart disease with heart failure: Secondary | ICD-10-CM | POA: Insufficient documentation

## 2023-12-10 DIAGNOSIS — I4891 Unspecified atrial fibrillation: Secondary | ICD-10-CM

## 2023-12-10 HISTORY — PX: ATRIAL FIBRILLATION ABLATION: EP1191

## 2023-12-10 LAB — POCT ACTIVATED CLOTTING TIME
Activated Clotting Time: 268 s
Activated Clotting Time: 326 s

## 2023-12-10 SURGERY — ATRIAL FIBRILLATION ABLATION
Anesthesia: General

## 2023-12-10 MED ORDER — FENTANYL CITRATE (PF) 100 MCG/2ML IJ SOLN
INTRAMUSCULAR | Status: AC
  Filled 2023-12-10: qty 2

## 2023-12-10 MED ORDER — FENTANYL CITRATE (PF) 100 MCG/2ML IJ SOLN
INTRAMUSCULAR | Status: DC | PRN
  Administered 2023-12-10 (×2): 50 ug via INTRAVENOUS

## 2023-12-10 MED ORDER — HEPARIN SODIUM (PORCINE) 1000 UNIT/ML IJ SOLN
INTRAMUSCULAR | Status: AC
  Filled 2023-12-10: qty 10

## 2023-12-10 MED ORDER — SODIUM CHLORIDE 0.9 % IV SOLN: 250.0000 mL | INTRAVENOUS | Status: DC | PRN

## 2023-12-10 MED ORDER — ONDANSETRON HCL 4 MG/2ML IJ SOLN: 4.0000 mg | Freq: Four times a day (QID) | INTRAMUSCULAR | Status: DC | PRN

## 2023-12-10 MED ORDER — ACETAMINOPHEN 325 MG PO TABS
650.0000 mg | ORAL_TABLET | ORAL | Status: DC | PRN
  Administered 2023-12-10: 650 mg via ORAL

## 2023-12-10 MED ORDER — PHENYLEPHRINE 80 MCG/ML (10ML) SYRINGE FOR IV PUSH (FOR BLOOD PRESSURE SUPPORT)
PREFILLED_SYRINGE | INTRAVENOUS | Status: DC | PRN
  Administered 2023-12-10 (×2): 120 ug via INTRAVENOUS
  Administered 2023-12-10: 80 ug via INTRAVENOUS
  Administered 2023-12-10: 160 ug via INTRAVENOUS

## 2023-12-10 MED ORDER — LIDOCAINE HCL (CARDIAC) PF 100 MG/5ML IV SOSY
PREFILLED_SYRINGE | INTRAVENOUS | Status: DC | PRN
  Administered 2023-12-10: 80 mg via INTRAVENOUS

## 2023-12-10 MED ORDER — SODIUM CHLORIDE 0.9% FLUSH: 3.0000 mL | INTRAVENOUS | Status: DC | PRN

## 2023-12-10 MED ORDER — ATROPINE SULFATE 1 MG/10ML IJ SOSY
PREFILLED_SYRINGE | INTRAMUSCULAR | Status: AC
  Filled 2023-12-10: qty 10

## 2023-12-10 MED ORDER — SODIUM CHLORIDE 0.9% FLUSH: 3.0000 mL | Freq: Two times a day (BID) | INTRAVENOUS | Status: DC

## 2023-12-10 MED ORDER — HEPARIN SODIUM (PORCINE) 1000 UNIT/ML IJ SOLN
INTRAMUSCULAR | Status: DC | PRN
  Administered 2023-12-10: 1000 [IU] via INTRAVENOUS

## 2023-12-10 MED ORDER — PROPOFOL 10 MG/ML IV BOLUS
INTRAVENOUS | Status: DC | PRN
  Administered 2023-12-10: 120 mg via INTRAVENOUS

## 2023-12-10 MED ORDER — SUGAMMADEX SODIUM 200 MG/2ML IV SOLN
INTRAVENOUS | Status: DC | PRN
  Administered 2023-12-10: 200 mg via INTRAVENOUS

## 2023-12-10 MED ORDER — ONDANSETRON HCL 4 MG/2ML IJ SOLN
INTRAMUSCULAR | Status: DC | PRN
  Administered 2023-12-10: 4 mg via INTRAVENOUS

## 2023-12-10 MED ORDER — HEPARIN (PORCINE) IN NACL 1000-0.9 UT/500ML-% IV SOLN
INTRAVENOUS | Status: DC | PRN
  Administered 2023-12-10 (×2): 500 mL

## 2023-12-10 MED ORDER — PROTAMINE SULFATE 10 MG/ML IV SOLN
INTRAVENOUS | Status: DC | PRN
  Administered 2023-12-10: 40 mg via INTRAVENOUS

## 2023-12-10 MED ORDER — MIDAZOLAM HCL 2 MG/2ML IJ SOLN
INTRAMUSCULAR | Status: AC
  Filled 2023-12-10: qty 2

## 2023-12-10 MED ORDER — SODIUM CHLORIDE 0.9 % IV SOLN: INTRAVENOUS | Status: DC

## 2023-12-10 MED ORDER — HEPARIN SODIUM (PORCINE) 1000 UNIT/ML IJ SOLN
INTRAMUSCULAR | Status: DC | PRN
  Administered 2023-12-10: 4000 [IU] via INTRAVENOUS
  Administered 2023-12-10: 7000 [IU] via INTRAVENOUS
  Administered 2023-12-10: 14000 [IU] via INTRAVENOUS

## 2023-12-10 MED ORDER — MIDAZOLAM HCL (PF) 2 MG/2ML IJ SOLN
INTRAMUSCULAR | Status: DC | PRN
  Administered 2023-12-10: 2 mg via INTRAVENOUS

## 2023-12-10 MED ORDER — EPHEDRINE SULFATE-NACL 50-0.9 MG/10ML-% IV SOSY
PREFILLED_SYRINGE | INTRAVENOUS | Status: DC | PRN
  Administered 2023-12-10 (×2): 5 mg via INTRAVENOUS

## 2023-12-10 MED ORDER — ROCURONIUM BROMIDE 10 MG/ML (PF) SYRINGE
PREFILLED_SYRINGE | INTRAVENOUS | Status: DC | PRN
Start: 2023-12-10 — End: 2023-12-10
  Administered 2023-12-10 (×2): 20 mg via INTRAVENOUS
  Administered 2023-12-10: 60 mg via INTRAVENOUS

## 2023-12-10 MED ORDER — ATROPINE SULFATE 1 MG/10ML IJ SOSY
PREFILLED_SYRINGE | INTRAMUSCULAR | Status: DC | PRN
  Administered 2023-12-10: 1 mg via INTRAVENOUS

## 2023-12-10 MED ORDER — PHENYLEPHRINE HCL-NACL 20-0.9 MG/250ML-% IV SOLN
INTRAVENOUS | Status: DC | PRN
  Administered 2023-12-10: 25 ug/min via INTRAVENOUS

## 2023-12-10 MED ORDER — DEXAMETHASONE SOD PHOSPHATE PF 10 MG/ML IJ SOLN
INTRAMUSCULAR | Status: DC | PRN
Start: 2023-12-10 — End: 2023-12-10
  Administered 2023-12-10: 10 mg via INTRAVENOUS

## 2023-12-10 SURGICAL SUPPLY — 18 items
BLANKET WARM UNDERBOD FULL ACC (MISCELLANEOUS) ×1 IMPLANT
CABLE VARIPULSE STERILE (CATHETERS) IMPLANT
CATH DECANAV D CURVE (CATHETERS) IMPLANT
CATH EZ STEER NAV 8MM D-F CUR (ABLATOR) IMPLANT
CATH GE 8FR SOUNDSTAR (CATHETERS) IMPLANT
CATHETER VARIPULSE 8.5FR (CATHETERS) IMPLANT
CLOSURE MYNX CONTROL 6F/7F (Vascular Products) IMPLANT
CLOSURE PERCLOSE PROSTYLE (Vascular Products) IMPLANT
COVER SWIFTLINK CONNECTOR (BAG) ×1 IMPLANT
PACK EP LF (CUSTOM PROCEDURE TRAY) ×1 IMPLANT
PAD DEFIB RADIO PHYSIO CONN (PAD) ×1 IMPLANT
PATCH CARTO3 (PAD) IMPLANT
SHEATH BAYLIS TRANSSEPTAL 98CM (NEEDLE) IMPLANT
SHEATH CARTO VIZIGO MED CURVE (SHEATH) IMPLANT
SHEATH PINNACLE 8F 10CM (SHEATH) IMPLANT
SHEATH PINNACLE 9F 10CM (SHEATH) IMPLANT
SHEATH PROBE COVER 6X72 (BAG) IMPLANT
TUBING SMART ABLATE COOLFLOW (TUBING) IMPLANT

## 2023-12-10 NOTE — Anesthesia Postprocedure Evaluation (Signed)
 Anesthesia Post Note  Patient: [name] Sanford  Procedure(s) Performed: ATRIAL FIBRILLATION ABLATION     Patient location during evaluation: PACU Anesthesia Type: General Level of consciousness: awake and alert Pain management: pain level controlled Vital Signs Assessment: post-procedure vital signs reviewed and stable Respiratory status: spontaneous breathing, nonlabored ventilation, respiratory function stable and patient connected to nasal cannula oxygen Cardiovascular status: blood pressure returned to baseline and stable Postop Assessment: no apparent nausea or vomiting Anesthetic complications: no   There were no known notable events for this encounter.  Last Vitals:  Vitals:   12/10/23 1400 12/10/23 1430  BP: (!) 150/72 138/67  Pulse: 75 74  Resp: 17 16  Temp:    SpO2: 95% 92%    Last Pain:  Vitals:   12/10/23 1404  TempSrc:   PainSc: 6                  [name]

## 2023-12-10 NOTE — Transfer of Care (Signed)
 Immediate Anesthesia Transfer of Care Note  Patient: [name] Sanford  Procedure(s) Performed: ATRIAL FIBRILLATION ABLATION  Patient Location: Cath Lab  Anesthesia Type:General  Level of Consciousness: awake, alert , oriented, drowsy, and patient cooperative  Airway & Oxygen Therapy: Patient Spontanous Breathing and Patient connected to face mask oxygen  Post-op Assessment: Report given to RN and Post -op Vital signs reviewed and stable  Post vital signs: Reviewed and stable  Last Vitals:  Vitals Value Taken Time  BP 136/62 1143  Temp    Pulse 92   Resp 29   SpO2 96%     Last Pain:  Vitals:   12/10/23 0815  TempSrc: Oral         Complications: There were no known notable events for this encounter.

## 2023-12-10 NOTE — Anesthesia Procedure Notes (Signed)
 Procedure Name: Intubation Date/Time: 12/10/2023 10:06 AM  Performed by: Cindie Donald CROME, CRNAPre-anesthesia Checklist: Patient identified, Emergency Drugs available, Suction available and Patient being monitored Patient Re-evaluated:Patient Re-evaluated prior to induction Oxygen Delivery Method: Circle System Utilized Preoxygenation: Pre-oxygenation with 100% oxygen Induction Type: IV induction Ventilation: Mask ventilation without difficulty and Oral airway inserted - appropriate to patient size Laryngoscope Size: Mac and 4 Grade View: Grade I Tube type: Oral Tube size: 7.5 mm Number of attempts: 1 Airway Equipment and Method: Stylet and Oral airway Placement Confirmation: ETT inserted through vocal cords under direct vision, positive ETCO2 and breath sounds checked- equal and bilateral Secured at: 22 cm Tube secured with: Tape Dental Injury: Teeth and Oropharynx as per pre-operative assessment

## 2023-12-10 NOTE — Discharge Instructions (Signed)

## 2023-12-10 NOTE — Anesthesia Preprocedure Evaluation (Signed)
 Anesthesia Evaluation  Patient identified by MRN, date of birth, ID band Patient awake    Reviewed: Allergy & Precautions, NPO status , Patient's Chart, lab work & pertinent test results  Airway Mallampati: I  TM Distance: >3 FB Neck ROM: Full    Dental  (+) Edentulous Upper, Edentulous Lower   Pulmonary COPD,  COPD inhaler, Current Smoker    + decreased breath sounds      Cardiovascular +CHF  + dysrhythmias Atrial Fibrillation  Rhythm:Irregular Rate:Normal  Echo:   1. Left ventricular ejection fraction, by estimation, is 60 to 65%. The  left ventricle has normal function. The left ventricle has no regional  wall motion abnormalities. Left ventricular diastolic parameters are  indeterminate. The average left  ventricular global longitudinal strain is -20.4 %. The global longitudinal  strain is normal.   2. Right ventricular systolic function is normal. The right ventricular  size is normal. There is mildly elevated pulmonary artery systolic  pressure.   3. The mitral valve is normal in structure. No evidence of mitral valve  regurgitation. No evidence of mitral stenosis.   4. The aortic valve is normal in structure. Aortic valve regurgitation is  not visualized. No aortic stenosis is present.   5. The inferior vena cava is normal in size with greater than 50%  respiratory variability, suggesting right atrial pressure of 3 mmHg.     Neuro/Psych  PSYCHIATRIC DISORDERS Anxiety Depression    negative neurological ROS     GI/Hepatic negative GI ROS, Neg liver ROS,,,  Endo/Other  negative endocrine ROS    Renal/GU negative Renal ROS     Musculoskeletal  (+) Arthritis ,    Abdominal   Peds  Hematology negative hematology ROS (+)   Anesthesia Other Findings   Reproductive/Obstetrics                              Anesthesia Physical Anesthesia Plan  ASA: 3  Anesthesia Plan: General    Post-op Pain Management:    Induction: Intravenous  PONV Risk Score and Plan: 1 and Ondansetron and Midazolam  Airway Management Planned: Oral ETT  Additional Equipment: None  Intra-op Plan:   Post-operative Plan: Extubation in OR  Informed Consent: I have reviewed the patients History and Physical, chart, labs and discussed the procedure including the risks, benefits and alternatives for the proposed anesthesia with the patient or authorized representative who has indicated his/her understanding and acceptance.     Dental advisory given  Plan Discussed with: CRNA  Anesthesia Plan Comments:         Anesthesia Quick Evaluation

## 2023-12-10 NOTE — H&P (Signed)
  Electrophysiology Office Note:   Date:  12/10/2023  ID:  Jahan Friedlander, DOB 10-13-1955, MRN [id number]  Primary Cardiologist: Lamar Fitch, MD Primary Heart Failure: None Electrophysiologist: Gurkirat Basher Gladis Norton, MD      History of Present Illness:   Mohmed Farver is a 68 y.o. male with h/o COPD, hypertension, diabetes, tobacco abuse, diastolic heart failure, atrial fibrillation seen today for routine electrophysiology followup.   Today, denies symptoms of palpitations, chest pain, dyspnea, orthopnea, PND, lower extremity edema, claudication, dizziness, presyncope, syncope, bleeding, or neurologic sequela. The patient is tolerating medications without difficulties. Plan ablation today for AF/flutter.   EP Information / Studies Reviewed:    EKG is not ordered today. EKG from 07/29/2023 reviewed which showed atrial fibrillation       Risk Assessment/Calculations:    CHA2DS2-VASc Score = 4   This indicates a 4.8% annual risk of stroke. The patient's score is based upon: CHF History: 1 HTN History: 1 Diabetes History: 1 Stroke History: 0 Vascular Disease History: 0 Age Score: 1 Gender Score: 0            Physical Exam:   VS:  There were no vitals taken for this visit.   Wt Readings from Last 3 Encounters:  09/15/23 93.6 kg  07/29/23 95.7 kg  07/07/23 96.4 kg    GEN: Well nourished, well developed in no acute distress NECK: No JVD; No carotid bruits CARDIAC: Regular rate and rhythm, no murmurs, rubs, gallops RESPIRATORY:  Clear to auscultation without rales, wheezing or rhonchi  ABDOMEN: Soft, non-tender, non-distended EXTREMITIES:  No edema; No deformity    ASSESSMENT AND PLAN:    1.  Persistent atrial fibrillation/flutter: Huntington Leverich has presented today for surgery, with the diagnosis of AF.  The various methods of treatment have been discussed with the patient and family. After consideration of risks, benefits and other options for treatment, the patient has  consented to  Procedure(s): Catheter ablation as a surgical intervention .  Risks include but not limited to complete heart block, stroke, esophageal damage, nerve damage, bleeding, vascular damage, tamponade, perforation, MI, and death. The patient's history has been reviewed, patient examined, no change in status, stable for surgery.  I have reviewed the patient's chart and labs.  Questions were answered to the patient's satisfaction.    Corrine Tillis Norton, MD 12/10/2023 7:38 AM

## 2023-12-11 ENCOUNTER — Telehealth (HOSPITAL_COMMUNITY): Payer: Self-pay

## 2023-12-11 ENCOUNTER — Encounter (HOSPITAL_COMMUNITY): Payer: Self-pay | Admitting: Cardiology

## 2023-12-11 NOTE — Telephone Encounter (Signed)
 Spoke with patient to complete post procedure follow up call.  Patient reports no complications with groin sites.   Instructions reviewed with patient:  Remove large bandage at puncture site after 24 hours. It is normal to have bruising, tenderness, mild swelling, and a pea or marble sized lump/knot at the groin site which can take up to three months to resolve.  Get help right away if you notice sudden swelling at the puncture site.  Check your puncture site every day for signs of infection: fever, redness, swelling, pus drainage, warmth, foul odor or excessive pain. If this occurs, please call [phone], to speak with the RN Navigator. Get help right away if your puncture site is bleeding and the bleeding does not stop after applying firm pressure to the area.  You may continue to have skipped beats/ atrial fibrillation during the first several months after your procedure.  It is very important not to miss any doses of your blood thinner Xarelto .    You will follow up with the Afib clinic 4 weeks after your procedure and follow up with the Afib clinic 3 months after your procedure.  Activity restrictions reviewed.  Patient verbalized understanding to all instructions provided.

## 2023-12-15 ENCOUNTER — Encounter: Payer: Self-pay | Admitting: Emergency Medicine

## 2023-12-26 ENCOUNTER — Other Ambulatory Visit (HOSPITAL_BASED_OUTPATIENT_CLINIC_OR_DEPARTMENT_OTHER): Payer: Self-pay

## 2023-12-26 MED ORDER — OXYCODONE HCL 30 MG PO TABS
30.0000 mg | ORAL_TABLET | Freq: Every day | ORAL | 0 refills | Status: AC
  Filled 2023-12-26: qty 150, 30d supply, fill #0

## 2024-01-07 ENCOUNTER — Ambulatory Visit (HOSPITAL_COMMUNITY)
Admission: RE | Admit: 2024-01-07 | Discharge: 2024-01-07 | Disposition: A | Discharge status: Home / Self Care | Source: Ambulatory Visit | Attending: Physician Assistant | Admitting: Physician Assistant

## 2024-01-07 VITALS — BP 140/62 | HR 54 | Ht 72.0 in | Wt 204.4 lb

## 2024-01-07 DIAGNOSIS — I4819 Other persistent atrial fibrillation: Secondary | ICD-10-CM

## 2024-01-07 DIAGNOSIS — I4891 Unspecified atrial fibrillation: Secondary | ICD-10-CM

## 2024-01-07 DIAGNOSIS — D6869 Other thrombophilia: Secondary | ICD-10-CM | POA: Diagnosis not present

## 2024-01-07 DIAGNOSIS — Z79899 Other long term (current) drug therapy: Secondary | ICD-10-CM

## 2024-01-07 DIAGNOSIS — Z5181 Encounter for therapeutic drug level monitoring: Secondary | ICD-10-CM | POA: Diagnosis not present

## 2024-01-07 DIAGNOSIS — I483 Typical atrial flutter: Secondary | ICD-10-CM

## 2024-01-07 NOTE — Progress Notes (Signed)
 Primary Care Physician: Gayl Males, MD Primary Cardiologist: Dr Bernie Primary Electrophysiologist: Dr Inocencio  Referring Physician: Dr Joshua   Gil [name] Sanford is a 68 y.o. male with a history of COPD, HTN, DM, tobacco abuse, diastolic CHF, atrial fibrillation who presents for follow up in the [facility].  The patient was initially diagnosed with atrial fibrillation on 05/09/19 after presenting to the ED with symptoms of gradually worsening symptoms of SOB. He was found to be acutely fluid overloaded EF normal 55-60% and was admitted. Patient was started on Xarelto  for stoke prevention. Patient converted spontaneously to SR. Patient is s/p dofetilide  loading 4/20-4/23/21. He converted to SR with the medication and did not require DCCV.   He had recurrence of afib and underwent DCCV on 07/15/23. Unfortunately, he had quick return of afib. He is s/p afib and flutter ablation with Dr Inocencio on 12/10/23.  Patient returns for follow up for atrial fibrillation and dofetilide  monitoring. He remains in SR today and feels well. He did feel fatigued for about two weeks post ablation but his stamina has returned. No interim symptoms of afib. He denies chest pain or groin issues.   Today, he  denies symptoms of palpitations, chest pain, shortness of breath, orthopnea, PND, lower extremity edema, dizziness, presyncope, syncope, snoring, daytime somnolence, bleeding, or neurologic sequela. The patient is tolerating medications without difficulties and is otherwise without complaint today.    Atrial Fibrillation Risk Factors:  he does not have symptoms or diagnosis of sleep apnea. he does not have a history of rheumatic fever. he does not have a history of alcohol  use. The patient does not have a history of early familial atrial fibrillation or other arrhythmias.  Atrial Fibrillation Management history:  Previous antiarrhythmic drugs: dofetilide  Previous cardioversions:  07/15/23 Previous ablations: 12/10/23 Anticoagulation history: Xarelto     Past Medical History:  Diagnosis Date   Abscess of right groin 08/25/2018   Acute exacerbation of CHF (congestive heart failure) (HCC) 05/09/2019   Anterior to posterior tear of superior glenoid labrum of left shoulder 07/12/2021   Anxiety and depression    Arthritis    Arthropathy of lumbar facet joint 01/17/2020   Atrial fibrillation (HCC)    Atrial fibrillation (HCC)    Atrial flutter (HCC) 05/18/2019   Back pain    Bilateral thoracic back pain 12/10/2013   Chronic back pain 12/10/2013   COPD (chronic obstructive pulmonary disease) (HCC)    Hematuria, gross 06/19/2016   History of colon polyps    Hx of lumbosacral spine surgery 12/31/2016   Low back pain 09/13/2014   Malfunction of spinal cord stimulator 04/01/2017   Mechanical complication nervous system device/implant/graft 12/10/2013   Nocturnal hypoxemia 09/03/2019   Pain in right knee 05/13/2019   Paroxysmal atrial fibrillation (HCC) 05/18/2019   Persistent atrial fibrillation (HCC) 06/08/2019   Post laminectomy syndrome 04/01/2017   Precordial pain 07/19/2016   Recurrent UTI 06/04/2016   Right inguinal pain 08/25/2018   Secondary hypercoagulable state 05/18/2019   Smoking 07/19/2016   Status post lumbar spinal fusion 09/13/2014   Visit for monitoring Tikosyn  therapy 06/08/2019    Current Outpatient Medications  Medication Sig Dispense Refill   albuterol  (PROVENTIL ) (2.5 MG/3ML) 0.083% nebulizer solution Inhale 3 mLs into the lungs every 4 (four) hours as needed for shortness of breath.     albuterol  (VENTOLIN  HFA) 108 (90 Base) MCG/ACT inhaler Inhale 1-2 puffs into the lungs every 6 (six) hours as needed for wheezing or shortness of  breath.     Budeson-Glycopyrrol-Formoterol  (BREZTRI AEROSPHERE) 160-9-4.8 MCG/ACT AERO Inhale 2 puffs into the lungs 2 (two) times daily.     diclofenac (VOLTAREN) 75 MG EC tablet Take 75 mg by mouth 2 (two)  times daily.     dofetilide  (TIKOSYN ) 500 MCG capsule Take 1 capsule (500 mcg total) by mouth 2 (two) times daily. 180 capsule 2   DULoxetine (CYMBALTA) 30 MG capsule Take 30 mg by mouth 2 (two) times daily.     hydroxypropyl methylcellulose / hypromellose (ISOPTO TEARS / GONIOVISC) 2.5 % ophthalmic solution Place 1 drop into both eyes as needed for dry eyes.     LINZESS  290 MCG CAPS capsule Take 290 mcg by mouth daily as needed (constipation). (Patient taking differently: Take 290 mcg by mouth as needed (constipation).)     mometasone  (ELOCON ) 0.1 % cream Apply 1 application topically as needed (rosacea).      oxycodone  (ROXICODONE ) 30 MG immediate release tablet Take 15-30 mg by mouth See admin instructions. Five time a day as needed     oxycodone  (ROXICODONE ) 30 MG immediate release tablet Take 1 tablet (30 mg total) by mouth 5 (five) times daily as needed 150 tablet 0   tadalafil (CIALIS) 5 MG tablet Take 5 mg by mouth daily.     tamsulosin (FLOMAX) 0.4 MG CAPS capsule Take 0.4 mg by mouth daily after supper.     XARELTO  20 MG TABS tablet TAKE 1 TABLET(20 MG) BY MOUTH DAILY WITH SUPPER 90 tablet 1   No current facility-administered medications for this encounter.    ROS- All systems are reviewed and negative except as per the HPI above.  Physical Exam: Vitals:   01/07/24 1348  BP: (!) 140/62  Pulse: (!) 54  Weight: 92.7 kg  Height: 6' (1.829 m)    GEN: Well nourished, well developed in no acute distress CARDIAC: Regular rate and rhythm, no murmurs, rubs, gallops RESPIRATORY:  Clear to auscultation without rales, wheezing or rhonchi  ABDOMEN: Soft, non-tender, non-distended EXTREMITIES:  No edema; No deformity    Wt Readings from Last 3 Encounters:  01/07/24 92.7 kg  12/10/23 91.6 kg  09/15/23 93.6 kg    EKG Interpretation Date/Time:  Wednesday January 07 2024 13:55:10 EST Ventricular Rate:  54 PR Interval:  198 QRS Duration:  84 QT Interval:  452 QTC  Calculation: 428 R Axis:   67  Text Interpretation: Sinus bradycardia Otherwise normal ECG When compared with ECG of 29-Jul-2023 09:20, Sinus rhythm has replaced Atrial fibrillation Confirmed by Chia Mowers ([id number]) on 01/07/2024 2:06:40 PM   Echo 09/02/23 demonstrated   1. Left ventricular ejection fraction, by estimation, is 60 to 65%. The  left ventricle has normal function. The left ventricle has no regional  wall motion abnormalities. Left ventricular diastolic parameters are  indeterminate. The average left ventricular global longitudinal strain is -20.4 %. The global longitudinal strain is normal.   2. Right ventricular systolic function is normal. The right ventricular  size is normal. There is mildly elevated pulmonary artery systolic  pressure.   3. The mitral valve is normal in structure. No evidence of mitral valve  regurgitation. No evidence of mitral stenosis.   4. The aortic valve is normal in structure. Aortic valve regurgitation is  not visualized. No aortic stenosis is present.   5. The inferior vena cava is normal in size with greater than 50%  respiratory variability, suggesting right atrial pressure of 3 mmHg.    Epic records are reviewed at  length today   CHA2DS2-VASc Score = 4  The patient's score is based upon: CHF History: 1 HTN History: 1 Diabetes History: 1 Stroke History: 0 Vascular Disease History: 0 Age Score: 1 Gender Score: 0       ASSESSMENT AND PLAN: Persistent Atrial Fibrillation/atrial flutter (ICD10:  I48.19) The patient's CHA2DS2-VASc score is 4, indicating a 4.8% annual risk of stroke.   S/p dofetilide  loading 2021 S/p afib and flutter ablation 12/10/23 Patient appears to be maintaining SR Continue dofetilide  500 mcg BID Continue Xarelto  20 mg daily with no missed doses for 3 months post ablation.   Secondary Hypercoagulable State (ICD10:  D68.69) The patient is at significant risk for stroke/thromboembolism based upon his  CHA2DS2-VASc Score of 4.  Continue Rivaroxaban  (Xarelto ).   High Risk Medication Monitoring (ICD 10: [id number]) Patient requires ongoing monitoring for anti-arrhythmic medication which has the potential to cause life threatening arrhythmias. QT interval on ECG acceptable for dofetilide  monitoring. Check bmet/mag today.     Chronic HFpEF EF 60-65% GDMT per primary cardiology team Fluid status appears stable today  HTN Stable on current regimen   Follow up in the AF clinic in 2 months.    [name] Kicks PA-C [facility] [address] [place], [place] [address] [phone] 01/07/2024 2:22 PM

## 2024-01-08 ENCOUNTER — Ambulatory Visit (HOSPITAL_COMMUNITY): Payer: Self-pay | Admitting: Physician Assistant

## 2024-01-08 LAB — BASIC METABOLIC PANEL WITH GFR
BUN/Creatinine Ratio: 18 (ref 10–24)
BUN: 21 mg/dL (ref 8–27)
CO2: 26 mmol/L (ref 20–29)
Calcium: 8.9 mg/dL (ref 8.6–10.2)
Chloride: 99 mmol/L (ref 96–106)
Creatinine, Ser: 1.17 mg/dL (ref 0.76–1.27)
Glucose: 82 mg/dL (ref 70–99)
Potassium: 4.7 mmol/L (ref 3.5–5.2)
Sodium: 139 mmol/L (ref 134–144)
eGFR: 69 mL/min/1.73 (ref >59)

## 2024-01-08 LAB — MAGNESIUM: Magnesium: 2.2 mg/dL (ref 1.6–2.3)

## 2024-01-21 ENCOUNTER — Other Ambulatory Visit (HOSPITAL_BASED_OUTPATIENT_CLINIC_OR_DEPARTMENT_OTHER): Payer: Self-pay

## 2024-01-23 ENCOUNTER — Other Ambulatory Visit (HOSPITAL_BASED_OUTPATIENT_CLINIC_OR_DEPARTMENT_OTHER): Payer: Self-pay

## 2024-01-23 MED ORDER — OXYCODONE HCL 30 MG PO TABS
30.0000 mg | ORAL_TABLET | Freq: Every day | ORAL | 0 refills | Status: AC | PRN
  Filled 2024-01-23: qty 150, 30d supply, fill #0

## 2024-02-23 ENCOUNTER — Other Ambulatory Visit (HOSPITAL_BASED_OUTPATIENT_CLINIC_OR_DEPARTMENT_OTHER): Payer: Self-pay

## 2024-03-10 ENCOUNTER — Ambulatory Visit (HOSPITAL_COMMUNITY): Admitting: Physician Assistant
